# Patient Record
Sex: Female | Born: 1955 | Race: White | Hispanic: No | Smoking: Never smoker
Health system: Southern US, Community
[De-identification: ages and names within clinical notes are randomized; demographics above are authoritative.]

## PROBLEM LIST (undated history)

## (undated) DIAGNOSIS — L509 Urticaria, unspecified: Secondary | ICD-10-CM

## (undated) DIAGNOSIS — T4145XA Adverse effect of unspecified anesthetic, initial encounter: Secondary | ICD-10-CM

## (undated) DIAGNOSIS — T8859XA Other complications of anesthesia, initial encounter: Secondary | ICD-10-CM

## (undated) DIAGNOSIS — F329 Major depressive disorder, single episode, unspecified: Secondary | ICD-10-CM

## (undated) DIAGNOSIS — I10 Essential (primary) hypertension: Secondary | ICD-10-CM

## (undated) DIAGNOSIS — F419 Anxiety disorder, unspecified: Secondary | ICD-10-CM

## (undated) DIAGNOSIS — F32A Depression, unspecified: Secondary | ICD-10-CM

## (undated) DIAGNOSIS — C801 Malignant (primary) neoplasm, unspecified: Secondary | ICD-10-CM

## (undated) DIAGNOSIS — D3000 Benign neoplasm of unspecified kidney: Secondary | ICD-10-CM

## (undated) HISTORY — PX: TUBAL LIGATION: SHX77

## (undated) HISTORY — DX: Urticaria, unspecified: L50.9

## (undated) HISTORY — DX: Essential (primary) hypertension: I10

## (undated) HISTORY — DX: Major depressive disorder, single episode, unspecified: F32.9

## (undated) HISTORY — DX: Anxiety disorder, unspecified: F41.9

## (undated) HISTORY — DX: Depression, unspecified: F32.A

## (undated) HISTORY — PX: OTHER SURGICAL HISTORY: SHX169

## (undated) HISTORY — PX: APPENDECTOMY: SHX54

---

## 1997-08-09 ENCOUNTER — Ambulatory Visit (HOSPITAL_BASED_OUTPATIENT_CLINIC_OR_DEPARTMENT_OTHER): Admission: RE | Admit: 1997-08-09 | Discharge: 1997-08-09 | Payer: Self-pay | Admitting: Otolaryngology

## 1999-12-29 HISTORY — PX: HIATAL HERNIA REPAIR: SHX195

## 2000-01-25 ENCOUNTER — Encounter (INDEPENDENT_AMBULATORY_CARE_PROVIDER_SITE_OTHER): Payer: Self-pay | Admitting: Specialist

## 2000-01-25 ENCOUNTER — Inpatient Hospital Stay (HOSPITAL_COMMUNITY): Admission: RE | Admit: 2000-01-25 | Discharge: 2000-01-28 | Payer: Self-pay | Admitting: *Deleted

## 2000-09-25 ENCOUNTER — Emergency Department (HOSPITAL_COMMUNITY): Admission: EM | Admit: 2000-09-25 | Discharge: 2000-09-25 | Payer: Self-pay

## 2000-09-28 ENCOUNTER — Emergency Department (HOSPITAL_COMMUNITY): Admission: EM | Admit: 2000-09-28 | Discharge: 2000-09-28 | Payer: Self-pay | Admitting: Emergency Medicine

## 2000-12-04 ENCOUNTER — Encounter: Payer: Self-pay | Admitting: Urology

## 2000-12-04 ENCOUNTER — Ambulatory Visit (HOSPITAL_COMMUNITY): Admission: RE | Admit: 2000-12-04 | Discharge: 2000-12-04 | Payer: Self-pay | Admitting: Urology

## 2003-07-26 ENCOUNTER — Encounter: Admission: RE | Admit: 2003-07-26 | Discharge: 2003-07-26 | Payer: Self-pay | Admitting: Internal Medicine

## 2003-08-09 ENCOUNTER — Encounter: Admission: RE | Admit: 2003-08-09 | Discharge: 2003-09-30 | Payer: Self-pay | Admitting: Orthopedic Surgery

## 2004-07-12 ENCOUNTER — Other Ambulatory Visit: Admission: RE | Admit: 2004-07-12 | Discharge: 2004-07-12 | Payer: Self-pay | Admitting: Internal Medicine

## 2005-10-28 ENCOUNTER — Ambulatory Visit: Payer: Self-pay | Admitting: Family Medicine

## 2005-11-29 ENCOUNTER — Ambulatory Visit: Payer: Self-pay | Admitting: Family Medicine

## 2005-11-29 LAB — CONVERTED CEMR LAB
BUN: 11 mg/dL (ref 6–23)
CO2: 29 meq/L (ref 19–32)
Calcium: 9.4 mg/dL (ref 8.4–10.5)
Chloride: 106 meq/L (ref 96–112)
Creatinine, Ser: 0.9 mg/dL (ref 0.4–1.2)
GFR calc non Af Amer: 71 mL/min
Glomerular Filtration Rate, Af Am: 86 mL/min/{1.73_m2}
Glucose, Bld: 103 mg/dL — ABNORMAL HIGH (ref 70–99)
Potassium: 4.8 meq/L (ref 3.5–5.1)
Sodium: 141 meq/L (ref 135–145)

## 2006-04-17 ENCOUNTER — Ambulatory Visit: Payer: Self-pay | Admitting: Family Medicine

## 2006-04-23 ENCOUNTER — Encounter: Payer: Self-pay | Admitting: Cardiology

## 2006-04-23 ENCOUNTER — Observation Stay (HOSPITAL_COMMUNITY): Admission: EM | Admit: 2006-04-23 | Discharge: 2006-04-24 | Payer: Self-pay | Admitting: Emergency Medicine

## 2006-04-23 ENCOUNTER — Ambulatory Visit: Payer: Self-pay | Admitting: *Deleted

## 2006-05-02 ENCOUNTER — Ambulatory Visit: Payer: Self-pay

## 2006-05-05 ENCOUNTER — Ambulatory Visit: Payer: Self-pay | Admitting: Cardiology

## 2006-06-13 ENCOUNTER — Ambulatory Visit: Payer: Self-pay | Admitting: Family Medicine

## 2006-06-16 DIAGNOSIS — Z8679 Personal history of other diseases of the circulatory system: Secondary | ICD-10-CM

## 2006-06-16 DIAGNOSIS — Z9189 Other specified personal risk factors, not elsewhere classified: Secondary | ICD-10-CM | POA: Insufficient documentation

## 2006-06-16 DIAGNOSIS — I10 Essential (primary) hypertension: Secondary | ICD-10-CM

## 2006-06-16 DIAGNOSIS — A6000 Herpesviral infection of urogenital system, unspecified: Secondary | ICD-10-CM

## 2006-06-16 DIAGNOSIS — Z8719 Personal history of other diseases of the digestive system: Secondary | ICD-10-CM | POA: Insufficient documentation

## 2006-07-31 ENCOUNTER — Ambulatory Visit: Payer: Self-pay | Admitting: Family Medicine

## 2006-07-31 DIAGNOSIS — F411 Generalized anxiety disorder: Secondary | ICD-10-CM

## 2009-11-25 ENCOUNTER — Emergency Department (HOSPITAL_COMMUNITY): Admission: EM | Admit: 2009-11-25 | Discharge: 2009-11-26 | Payer: Self-pay | Admitting: Emergency Medicine

## 2010-04-11 LAB — POCT I-STAT, CHEM 8
Glucose, Bld: 149 mg/dL — ABNORMAL HIGH (ref 70–99)
HCT: 44 % (ref 36.0–46.0)
Hemoglobin: 15 g/dL (ref 12.0–15.0)
Potassium: 4 mEq/L (ref 3.5–5.1)
Sodium: 138 mEq/L (ref 135–145)
TCO2: 23 mmol/L (ref 0–100)

## 2010-04-11 LAB — POCT CARDIAC MARKERS
CKMB, poc: <1 ng/mL — ABNORMAL LOW (ref 1.0–8.0)
CKMB, poc: <1 ng/mL — ABNORMAL LOW (ref 1.0–8.0)

## 2010-04-11 LAB — CBC
HCT: 40.4 % (ref 36.0–46.0)
Hemoglobin: 13.7 g/dL (ref 12.0–15.0)
MCH: 28.8 pg (ref 26.0–34.0)
MCHC: 33.9 g/dL (ref 30.0–36.0)
MCV: 85.1 fL (ref 78.0–100.0)
Platelets: 343 10*3/uL (ref 150–400)
RBC: 4.75 MIL/uL (ref 3.87–5.11)
RDW: 13.8 % (ref 11.5–15.5)
WBC: 10 10*3/uL (ref 4.0–10.5)

## 2010-04-11 LAB — DIFFERENTIAL
Basophils Relative: 0 % (ref 0–1)
Eosinophils Absolute: 0.1 10*3/uL (ref 0.0–0.7)
Eosinophils Relative: 1 % (ref 0–5)
Monocytes Relative: 6 % (ref 3–12)
Neutrophils Relative %: 76 % (ref 43–77)

## 2010-06-15 NOTE — H&P (Signed)
[name], [name]                   ACCOUNT NO.:  [id number]   MEDICAL RECORD NO.:  [id number]          PATIENT TYPE:  EMS   LOCATION:  MAJO                         FACILITY:  [facility]   PHYSICIAN:  Thomas C. Wall, MD, FACCDATE OF BIRTH:  1955-06-01   DATE OF ADMISSION:  04/22/2006  DATE OF DISCHARGE:                              HISTORY & PHYSICAL   CHIEF COMPLAINT:  Chest pain.   HISTORY OF PRESENT ILLNESS:  This is a 55 year old woman with a history  of hypertension who comes in with the onset of chest pressure this  evening around 7 p.m. while watching television.  She describes the pain  as a 2/3 pressure since it was not truly a pain.  It was associated with  some palmar sweating but no nausea and vomiting, shortness of breath, or  radiating pain.  She states that she had similar symptoms when her  husband from a massive pulmonary embolism.  She states that she has been  a little bit stressed out today after she scraped two cars in her garage  while trying to park her car.  She has noticed over the course of the  evening that her blood pressure has climbed and the systolic of 180 and  she had an overwhelming, flushing sensation from her head down.  The  patient denies any PND, orthopnea, or lower extremity edema.  She denies  any problems with any recent palpitations or focal weakness.  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   REVIEW OF SYSTEMS:  As above.  The patient also admits to having lost 35  pounds last year.  However, took on 13 pounds over the holidays.  Remaining 18-point review of systems is negative.   PAST MEDICAL HISTORY:  1. Hypertension.  2. History of paraesophageal hernia.  3. Palpitations.  4. Mitral valve prolapse.   ALLERGIES:  SULFA.   MEDICATIONS:  1. Aspirin 81 mg daily.  2. Benicar 40 mg daily.  This was increased to 40 mg approximately 4      days ago.  3. Toprol-XL 100 mg daily.  4. __________ multivitamins.  5. Vitamin C, vitamin E, vitamin D supplements.  6. Xanax p.r.n.   SOCIAL HISTORY:  She lives in [place] with her son who is currently  traveling [place].  She is currently unemployed.  She has never smoked.  She does not use drugs or alcohol.  She is widowed.  Her husband died of  a massive PE.   FAMILY HISTORY:  Mother is alive with hypertension.  Her father is  alive.  Had an MI at age 23 and has already had a quadruple bypass and  has multiple peripheral vascular disease problems.   PHYSICAL EXAMINATION:  VITAL SIGNS:  Blood pressure 179/103, respiratory  rate 18, pulse 96, temperature 98.2, oxygen saturation 96% on room air.  GENERAL:  She is alert and oriented x3.  No acute distress.  HEENT:  Normocephalic, atraumatic.  PERRL.  Oropharynx clear.  NECK:  Supple.  No lymphadenopathy.  2+ carotid impulses, symmetrically  bilaterally.  JVP is flat.  LUNGS:  Clear to auscultation bilaterally without any residual rhonchi  or rales.  CARDIOVASCULAR:  Regular rate and rhythm.  Normal S1 and S2.  No  murmurs, rubs or gallops.  ABDOMEN:  Positive bowel sounds.  Soft, nontender, nondistended without  any palpable masses.  EXTREMITIES:  No rashes or lesions.  Two plus radial and posterior  tibialis pulses bilaterally.  No lower extremity edema, clubbing or  cyanosis.  NEUROLOGIC:  Alert and oriented x3.  Cranial nerves III-XII intact.  She  is moving all extremities spontaneously and shows 5/5 motor strength  symmetrically bilaterally.   LABORATORY DATA:  Chest x-ray unremarkable without any evidence of  effusion or infiltrate.  EKG shows a normal sinus rhythm at a rate of  107 with a normal axis with about 1 mm ST depression  in V5 and V6 as  well as leads I and II.  This is different from an EKG compared to  September 2002.  White count is 10.7, hematocrit of 41, platelets 297.  __________ 3.9.  Creatinine 1.  Glucose 130.  First set of cardiac  markers are negative.  A D-dimer is  less than 0.22.   ASSESSMENT:  1. Atypical chest pain, most likely secondary to poorly controlled      hypertension.  2. Anxiety.   PLAN:  The patient will be admitted to a telemetry bed for observation.  This given her elevated blood pressures and some ECG changes that are  new that can be suggestive of ischemia.  She will be ruled out for an MI  by cardiac enzymes x3.  However, I think there is low probability of  acute coronary syndrome given the other circumstances surrounding her  presentation, most notably her high blood pressure and anxiety.  I have  added HCTZ to her blood pressure regimen as well as Nitrol paste at this  point in time to her outpatient regimen of Benicar and Toprol-XL.  Her  Benicar has been recently increased to 40 mg by her primary care  physician approximately four days ago.     ______________________________  [name]. Nichole Burger, MD      [name]. Nichole Squibb, MD, [facility]  Electronically Signed    BRS/MEDQ  D:  04/23/2006  T:  04/23/2006  Job:  [id number]

## 2010-06-15 NOTE — Discharge Summary (Signed)
[name], [name]                   ACCOUNT NO.:  [id number]   MEDICAL RECORD NO.:  [id number]          PATIENT TYPE:  OBV   LOCATION:  3703                         FACILITY:  [facility]   PHYSICIAN:  Jonelle Sidle, MD DATE OF BIRTH:  [birth year]   DATE OF ADMISSION:  04/22/2006  DATE OF DISCHARGE:  04/24/2006                               DISCHARGE SUMMARY   PRIMARY CARDIOLOGIST:  Dr. Simona Huh.   PRIMARY CARE PHYSICIAN:  [facility].   PROCEDURES PERFORMED DURING HOSPITALIZATION:  None.   PRIMARY DISCHARGE DIAGNOSES:  1. Chest pain.  2. Hypertension.  3. Frequent premature ventricular contractions.  4. Anxiety.   HISTORY OF PRESENT ILLNESS:  This is a 55 year old female who presented  to [facility] Emergency Room with a history of chest discomfort.  The  patient states it lasts a few seconds, is episodic.  The pain is located  in the left chest without radiation.  Characterized as sharp.  The  patient did not state that it caused any shortness of breath, dizziness,  nausea or diaphoresis.  The patient also has been having a lot of stress  in her life over the last few weeks prior to ER admission.  The patient  was seen and examined by cardiology fellow, Dr. Clelia Croft, and admitted to  rule out myocardial infarction.  The patient was placed on telemetry,  nitroglycerin topically and returned to current medications that she was  taking at home.   It was noted that the patient was hypertensive on admission with a blood  pressure of 179/103.  Pulse was 96.  Respirations were 18.  She was  placed back on Norvasc (she had been on this previously) in addition to  her regular regiment and blood pressure normalized to 116/88.  D-dimer  was completed and was found to be negative.  Troponins were cycled and  found to be negative x3.  All other lab work was within normal limits.   The patient was seen and examined the following morning by Dr. Simona Huh with review of  records and labs.  Patient's EKG did show sinus  tachycardia with nonspecific ST-T wave changes.  Heart rate improved  with hypertension control and anxiolytic therapy.  She also had no  further chest pain.   On day of discharge, the patient was seen and examined by Dr. Diona Browner  without any further evidence of chest pain or dyspnea with use of Xanax.  She was found to be stable for discharge and scheduled for outpatient  followup to include an adenosine Myoview stress test.   Echocardiogram was completed during hospitalization, read overall left  ventricular systolic function normal, left ventricular ejection fraction  was estimated to be 60%.  There was no diagnostic evidence of left  ventricular regional wall motion abnormalities.  Normal aortic valve and  normal mitral valve, as read per Dr. Willa Rough.   Labs, on discharge, hemoglobin 14.2, hematocrit 42.3, white blood cell  is 10.4, platelets 301.  Indices were negative.  Pro time 13.3, INR 1.0.  D-dimer negative at 0.22.  Sodium 139, potassium 3.5, chloride 108, CO2  24, glucose 125, BUN 15, creatinine 0.84.  LFTs were within normal  limits.  Troponin 0.04, 0.02 and 0.02 respectively.  Cholesterol 198,  triglycerides 54, HDL 62, LDL 125.  TSH 0.667.   Vitals, on discharge, blood pressure 116/88, heart rate 90, respirations  15, O2 saturation 99% on room air.   EKG:  Normal sinus rhythm with nonspecific ST-T wave abnormalities  anteriorly with T-wave inversion noted.   DISCHARGE APPOINTMENTS AND PLANS:  1. The patient is scheduled for a stress Myoview cardiac stress test      on April 4 at 7:15 a.m.  2. The patient is follow up with Dr. Diona Browner on April 7 at 4 p.m. for      discussion of results and discharge followup.  3. The patient is to make her own appointment with her primary care      physician on discharge.   DISCHARGE MEDICATIONS:  1. Benicar 40 mg once a day (prescription provided).  2. Xanax 0.25 mg twice a  day p.r.n. (prescription provided).  3. Metoprolol 100 mg daily (prescription provided).  4. Amlodipine 10 mg 1 p.o. daily (prescription provided).  5. Aspirin 81 mg a day.  6. Multivitamin daily as taken at home.   ALLERGIES:  SULFA.   Time spent with the patient, to include physician time, 30 minutes.      Bettey Mare. Lyman Bishop, NP      Jonelle Sidle, MD  Electronically Signed    KML/MEDQ  D:  04/24/2006  T:  04/24/2006  Job:  [id number]

## 2010-06-15 NOTE — Op Note (Signed)
[facility]  Patient:    [name], [name]                          MRN: [id number] Proc. Date: 01/25/00 Adm. Date:  [id number] Attending:  Stephenie Acres CC:         Roosvelt Harps, M.D.  Redmond Baseman, M.D.   Operative Report  PREOPERATIVE DIAGNOSIS:  Type 2 paraesophageal hernia.  POSTOPERATIVE DIAGNOSIS:  Type 2 paraesophageal hernia.  PROCEDURE:  Repair of paraesophageal hernia and gastropexy.  SURGEON:  Catalina Lunger, M.D.  ASSISTANT:  Anselm Pancoast. Zachery Dakins, M.D.  ANESTHESIA:  General.  INDICATIONS AND CONSENT:  The patient is a very pleasant 55 year old white female who had been noticing worsening epigastric and left chest pain over the last month.  The patients workup was consistent with a barium swallow which showed a rather large paraesophageal hernia.  Because of the nature and risks associated with paraesophageal hernia, I had a long discussion with the patient and we opted for surgical repair.  Informed consent was granted.  DESCRIPTION OF PROCEDURE:  The patient was taken to the operating room and placed in a supine position.  After adequate anesthesia was induced using endotracheal tube, the abdomen was prepped and draped in a normal sterile fashion.  Using an upper midline incision, I dissected down to the fascia. The fascia was opened vertically.  The peritoneum was entered.  The falciform ligament was divided and ligated with 2-0 silk ligatures.  The triangular ligament of the liver was taken down as was the coronary ligament.  This mobilized the left lateral segment of the liver and the gastrohepatic membrane was incised.  The esophagus was visualized and was found to be totally intra-abdominal.  However, there was a large left anterior paraesophageal hernia.  The stomach was reduced.  The hernia was then grasped with Allis clamps and dissected using Bovie electrocautery off the left crus of the diaphragm,  and off the left wall of the esophagus.  This was sent for pathologic evaluation.  The defect was then closed using interrupted #1 Tycron sutures, approximating the left and right crus.  These were spaced approximately 7-8 mm apart.  After the first stitch, this allowed my middle finger to easily slide next to the esophagus without any constriction.  Satisfied with the repair, I opted to pexed to the anterior abdominal wall.  Therefore, the fundus was pexed to the left anterior abdominal wall using interrupted 2-0 silk ligatures.  Adequate hemostasis was ensured.  The fascia was closed with a running #1 Novofil.  The skin was closed with staples.  The patient tolerated the procedure well and went to PACU in good condition. DD:  01/25/00 TD:  01/26/00 Job: [id number]

## 2010-06-15 NOTE — Assessment & Plan Note (Signed)
[facility]                            CARDIOLOGY OFFICE NOTE   NAME:Peloso, DEKISHA ZEEB                          MRN:          [id number]  DATE:05/05/2006                            DOB:          1955/08/17    PRIMARY CARE PHYSICIAN:  Leanne Chang, M.D.   REASON FOR VISIT:  Followup hospitalization.   HISTORY OF PRESENT ILLNESS:  I saw Ms. Doshier during a recent hospital  stay. She was admitted with chest pain and an abnormal electrocardiogram  also associated with hypertension. She was observed with medication  adjustments and noted ultimately to have normal troponin levels as well  as a normal D-dimer level. She improved after being started on Xanax and  also after being resumed on Norvasc which had been discontinued  previously. She had an echocardiogram obtained during the hospital stay  which demonstrated normal left ventricular systolic function at 60% with  no regional wall motion abnormalities and a normal mitral valve  (previously reported history of mitral valve prolapse). This was all  reviewed with the patient and she was discharged with scheduled followup  to have an outpatient myocardial perfusion study. She underwent an  exercise Myoview on the 4th of this month which demonstrated no sign of  scar or ischemia with an ejection fraction of 71%. Symptomatically, Ms.  Fernandez states that she feels much better. Her medications are outlined  below.   ALLERGIES:  SULFA DRUGS.   CURRENT MEDICATIONS:  1. Benicar 40 mg p.o. daily.  2. Metoprolol 100 mg p.o. daily.  3. Amlodipine 10 mg p.o. daily.  4. Aspirin 81 mg p.o. daily.  5. Multivitamin 1 p.o. daily.  6. Lysine.  7. Vitamin C.  8. Vitamin E.  9. Xanax 0.25 mg p.o. b.i.d. p.r.n.   REVIEW OF SYSTEMS:  As described in the history of present illness.   PHYSICAL EXAMINATION:  VITAL SIGNS:  Blood pressure is 128/85, heart  rate is 100 although decreases to 90 after being seated for several  minutes. Weight is 192 pounds.  GENERAL:  The patient is comfortable and in no acute distress.  NECK:  No elevated jugular venous pressure.  LUNGS:  Clear without labored breathing.  CARDIAC:  Reveals a regular rate and rhythm without murmur or gallop.  EXTREMITIES:  Showed no pitting edema.   IMPRESSION:  1. Recent presentation with chest pain, hypertension and anxiety.      Cardiac markers and D-dimer were normal. Followup Myoview also      shows no sign of scar or ischemia with normal ejection fraction. No      significant mitral valve prolapse identified by hospital      echocardiogram. At this point, we anticipate continued medical      therapy and observation. She will continue to followup with Dr.      Blossom Hoops at this point and we can see her back as needed.  2. Cardiology followup will be p.r.n.     Jonelle Sidle, MD  Electronically Signed    SGM/MedQ  DD: 05/05/2006  DT: 05/06/2006  Job #:  [id number]   cc:   Leanne Chang, M.D.

## 2010-06-15 NOTE — Discharge Summary (Signed)
[facility]  Patient:    [name], [name]                          MRN: [id number] Adm. Date:  [id number] Disc. Date: 01/28/00 Attending:  Stephenie Acres CC:         Roosvelt Harps, M.D.   Discharge Summary  ADMITTING DIAGNOSIS:  Paraesophageal hernia.  DISCHARGE DIAGNOSES:  Paraesophageal hernia.  CONDITION ON DISCHARGE:  Good.  CONSULTING PHYSICIAN:  Roosvelt Harps, M.D.  DISPOSITION:  Discharged to home.  FOLLOW-UP:  With me in seven days for staple removal.  HISTORY OF PRESENT ILLNESS:  The patient is a 55 year old white female with a symptomatic paraesophageal hernia.  The patient has undergone work-up including upper GI and is having increasingly symptomatic chest and abdominal pain and presents now for semi-elective repair.  For the remainder of the H&P, please see the chart.  HOSPITAL COURSE:  The patient was admitted and taken to the operating room where underwent repair of her paraesophageal hernia and gastropexy. Postoperatively her NG tube was removed on postoperative day #1.  She was maintained with epidural anesthesia.  She was ambulating by postoperative day #1.  By postoperative day #2, she was having no nausea and was started on a clear liquid diet.  She tolerated this well and began passing flatus.  By postoperative day #3, she tolerated a regular lunch and was ready for discharge home.  DISCHARGE MEDICATIONS: 1. Vicodin 1-2 p.o. q.4h. p.r.n. pain. 2. Toprol XL 50 mg p.o. q.a.m. DD:  01/28/00 TD:  01/28/00 Job: [id number]

## 2010-06-15 NOTE — Consult Note (Signed)
[facility]  Patient:    [name], [name] Visit Number: [id number] MRN: [id number]          Service Type: EMS Location: [place] Attending Physician:  Cathren Laine Dictated by:   Alvia Grove., M.D. Admit Date:  09/28/2000   CC:         Redmond Baseman, M.D.   Consultation Report  REASON FOR CONSULTATION:  [name] is a 55 year old female who was seen in the emergency room for episode of palpitations and lightheadedness.  HISTORY OF PRESENT ILLNESS:  [name] is a 55 year old female who has had palpitations since the mid-70s.  She had been previously diagnosed as having mitral valve prolapse.  Several years ago, she was diagnosed as having hypertension and was placed on Toprol XL.  She stopped taking the Toprol this past June.  She said she was tired of taking the medication.  She also joined [employer] this past year and has lost 35 pounds.  Starting this past Thursday, she has had lots of palpitations.  These palpitations are sudden, heart beat irregularities which cause her to be lightheaded, and are associated with some left arm tingling.  She developed some sweats and developed a cold sensation.  These palpitations are not associated with eating, drinking, or any sort of activity.  She has had some dizziness with walking and has been to the emergency room on two occasions.  She has not had any episodes of chest pain or shortness of breath.  She does not do any regular exercise.  CURRENT MEDICATIONS:  Toprol XL 50 mg q.d. for the past four days.  PAST MEDICAL HISTORY:  PVCs.  History of mitral valve prolapse diagnosed in the 1970s.  History of hypertension.  SOCIAL HISTORY:  The patient is a nonsmoker and a nondrinker.  FAMILY HISTORY:  Her mother and other female relatives have had arrhythmias. Her father had a myocardial infarction at a relatively young age but he was a smoker.  REVIEW OF SYSTEMS:  She denies any heat  or cold intolerance.  She has had a 35 pound weight loss after she joined [employer].  She denies any heat or cold intolerance.  She denies any problems with her eyes, ears, nose, and throat.  She denies any dizziness or vertigo symptoms.  She denies any shortness of breath.  She denies any chest pain.  She denies any syncopal episodes.  She denies any orthopnea or PND.  She denies any change in her bowel habits, hematuria, or blood in her stool.  She does state that her menstrual periods are coming slightly more frequent and closer together.  The quality of her periods has not changed.  Her review of systems was otherwise reviewed and is negative.  PHYSICAL EXAMINATION:  GENERAL:  She is a young female in no acute distress.  VITAL SIGNS:  Blood pressure is 148/97, heart rate is 88, and she is afebrile.  NECK:  There is 2+ carotids with no bruits.  There is no JVD, no thyromegaly. There is no lymphadenopathy.  LUNGS:  Clear to auscultation.  BACK:  Nontender.  HEART:  Regular rate, S1 and S2.  She has a very soft systolic ejection murmur.  I did not hear a midsystolic click.  Her PMI is nondisplaced.  ABDOMEN:  Mildly obese and is otherwise benign.  She has no hepatosplenomegaly, no masses, and no bruits.  EXTREMITIES:  She has no clubbing, cyanosis, or edema.  PULSES:  Her pulses are 2+ and symmetric bilaterally.  There is no calf tenderness.  NEUROLOGICAL:  Cranial nerves II-XII intact and her motor and sensory function are intact.  Her gait was not assessed but was reportedly fairly normal according to the nurse.  GENITOURINARY:  Deferred.  SKIN:  Warm and dry.  LABORATORY DATA:  Her EKG reveals normal sinus rhythm.  There are no ST or T-wave changes.  Her telemetry strips revealed normal sinus rhythm with several PVCs.  Her laboratory data show her sodium is 142, potassium is 4.1, chloride is 106. Hematocrit is 45%, hemoglobin is 15.  Her creatinine is  0.8.  IMPRESSION:  [name] presents with palpitations that are clearly premature ventricular contractions.  She also has hypertension and some orthostatic hypotension.  At this point, I suspect that her premature ventricular contractions are completely benign.  We will add a TSH to her current laboratory data.  I reassured her that her premature ventricular contractions are not life-threatening and that they should not cause her any particular trouble.  I told her that her weight loss and the increase stress in her life caused by family issues may be exacerbating her problems.  She also may be having some hormonal changes which may be contributing to her premature ventricular contractions.  We will increase her Toprol up to 100 mg a day.  This should help her hypertension as well as PVCs.  I have given her an additional prescription for Propranolol 10 mg tablets which she is to take four times a day as needed.  Because of her irregular periods, I have asked her to see her gynecologic doctor.  I will see her back in the office in a week or so.  We will need to get an echocardiogram for further evaluation. Dictated by:   Alvia Grove., M.D. Attending Physician:  Cathren Laine DD:  09/28/00 TD:  09/29/00 Job: [id number]

## 2011-09-25 DIAGNOSIS — E785 Hyperlipidemia, unspecified: Secondary | ICD-10-CM | POA: Insufficient documentation

## 2011-12-11 ENCOUNTER — Other Ambulatory Visit: Payer: Self-pay | Admitting: Nurse Practitioner

## 2011-12-11 DIAGNOSIS — R2230 Localized swelling, mass and lump, unspecified upper limb: Secondary | ICD-10-CM

## 2011-12-18 ENCOUNTER — Inpatient Hospital Stay: Admission: RE | Admit: 2011-12-18 | Payer: Self-pay | Source: Ambulatory Visit

## 2011-12-18 ENCOUNTER — Other Ambulatory Visit: Payer: Self-pay

## 2011-12-25 ENCOUNTER — Ambulatory Visit
Admission: RE | Admit: 2011-12-25 | Discharge: 2011-12-25 | Disposition: A | Discharge status: Home / Self Care | Payer: BC Managed Care – PPO | Source: Ambulatory Visit | Attending: Nurse Practitioner | Admitting: Nurse Practitioner

## 2011-12-25 DIAGNOSIS — R2230 Localized swelling, mass and lump, unspecified upper limb: Secondary | ICD-10-CM

## 2012-04-02 ENCOUNTER — Encounter: Payer: BC Managed Care – PPO | Admitting: Obstetrics & Gynecology

## 2012-04-13 ENCOUNTER — Ambulatory Visit (INDEPENDENT_AMBULATORY_CARE_PROVIDER_SITE_OTHER): Payer: BC Managed Care – PPO | Admitting: Obstetrics & Gynecology

## 2012-04-13 ENCOUNTER — Encounter: Payer: Self-pay | Admitting: Obstetrics & Gynecology

## 2012-04-13 VITALS — BP 143/90 | HR 77 | Temp 97.1°F | Ht 64.5 in | Wt 216.3 lb

## 2012-04-13 DIAGNOSIS — N898 Other specified noninflammatory disorders of vagina: Secondary | ICD-10-CM

## 2012-04-13 DIAGNOSIS — N950 Postmenopausal bleeding: Secondary | ICD-10-CM | POA: Insufficient documentation

## 2012-04-13 NOTE — Progress Notes (Signed)
Patient ID: [name], female   DOB: 26-Nov-1955, 57 y.o.   MRN: [id number]  Chief Complaint  Patient presents with  . Gynecologic Exam    HPI [name] is a 57 y.o. female.  Referred for evaluation of possible postmenopausal bleeding. She occasionally notes some pink discharge and lower abdominal cramps. No vaginal odor or itch.Patient's last menstrual period was 01/12/2011. [id number]  HPI  Past Medical History  Diagnosis Date  . Hypertension   . Depression   . Anxiety     Past Surgical History  Procedure Laterality Date  . Appendectomy    . Btl      Family History  Problem Relation Age of Onset  . Hypertension Mother   . Diabetes Father   . Cancer Father   . Hypertension Father   . Hypertension Sister   . Cancer Brother   . Hypertension Brother   . Cancer Paternal Aunt   . Cancer Maternal Grandmother   . Cancer Paternal Grandmother     Social History History  Substance Use Topics  . Smoking status: Never Smoker   . Smokeless tobacco: Not on file  . Alcohol Use: 0.6 oz/week    1 Glasses of wine per week    Allergies  Allergen Reactions  . Cola (Syrup) Anaphylaxis  . Sulfa Antibiotics Hives and Rash    Current Outpatient Prescriptions  Medication Sig Dispense Refill  . amLODipine-olmesartan (AZOR) 10-40 MG per tablet Take 1 tablet by mouth daily.      Marland Kitchen Lysine 500 MG CAPS Take by mouth.      . metoprolol succinate (TOPROL-XL) 100 MG 24 hr tablet Take 100 mg by mouth daily. Take with or immediately following a meal.      . Multiple Vitamins-Minerals (MULTIVITAMIN WITH MINERALS) tablet Take 1 tablet by mouth daily.      Marland Kitchen venlafaxine (EFFEXOR) 25 MG tablet Take 25 mg by mouth 2 (two) times daily.      . vitamin E 400 UNIT capsule Take 400 Units by mouth daily.       No current facility-administered medications for this visit.    Review of Systems Review of Systems  Constitutional: Negative.   Genitourinary: Positive for vaginal bleeding. Negative for  dysuria, urgency, vaginal discharge and menstrual problem.    Blood pressure 143/90, pulse 77, temperature 97.1 F (36.2 C), temperature source Oral, height 5' 4.5" (1.638 m), weight 216 lb 4.8 oz (98.113 kg), last menstrual period 01/12/2011.  Physical Exam Physical Exam  Nursing note and vitals reviewed. Constitutional: She appears well-developed and well-nourished. No distress.  Abdominal: Soft. She exhibits no distension and no mass.  Genitourinary: Vagina normal and uterus normal.  Scant mucus d/c with minimal pink staining, no cervical lesion, no masses or tenderness  Musculoskeletal: Normal range of motion. She exhibits no edema.  Skin: Skin is warm.  Psychiatric: She has a normal mood and affect.    Data Reviewed Normal pap Office notes  Assessment    Possible postmenopausal bleeding     Plan    Pelvic US Wet prep  RTC for result        [name] 04/13/2012, 2:04 PM

## 2012-04-13 NOTE — Patient Instructions (Signed)
Postmenopausal Bleeding Menopause is commonly referred to as the "change in life." It is a time when the fertile years, the time of ovulating and having menstrual periods, has come to an end. It is also determined by not having menstrual periods for 12 months.  Postmenopausal bleeding is any bleeding a woman has after she has entered into menopause. Any type of postmenopausal bleeding, even if it appears to be a typical menstrual period, is concerning. This should be evaluated by your caregiver.  CAUSES   Hormone therapy.  Cancer of the cervix or cancer of the lining of the uterus (endometrial cancer).  Thinning of the uterine lining (uterine atrophy).  Thyroid diseases.  Certain medicines.  Infection of the uterus or cervix.  Inflammation or irritation of the uterine lining (endometritis).  Estrogen-secreting tumors.  Growths (polyps) on the cervix, uterine lining, or uterus.  Uterine tumors (fibroids).  Being very overweight (obese). DIAGNOSIS  Your caregiver will take a medical history and ask questions. A physical exam will also be performed. Further tests may include:   A transvaginal ultrasound. An ultrasound wand or probe is inserted into your vagina to view the pelvic organs.  A biopsy of the lining of the uterus (endometrium). A sample of the endometrium is removed and examined.  A hysteroscopy. Your caregiver may use an instrument with a light and a camera attached to it (hysteroscope). The hysteroscope is used to look inside the uterus for problems.  A dilation and curettage (D&C). Tissue is removed from the uterine lining to be examined for problems. TREATMENT  Treatment depends on the cause of the bleeding. Some treatments include:   Surgery.  Medicines.  Hormones.  A hysteroscopy or D&C to remove polyps or fibroids.  Changing or stopping a current medicine you are taking. Talk to your caregiver about your specific treatment. HOME CARE INSTRUCTIONS    Maintain a healthy weight.  Keep regular pelvic exams and Pap tests. SEEK MEDICAL CARE IF:   You have bleeding, even if it is light in comparison to your previous periods.  Your bleeding lasts more than 1 week.  You have abdominal pain.  You develop bleeding with sexual intercourse. SEEK IMMEDIATE MEDICAL CARE IF:   You have a fever, chills, headache, dizziness, muscle aches, and bleeding.  You have severe pain with bleeding.  You are passing blood clots.  You have bleeding and need more than 1 pad an hour.  You feel faint. MAKE SURE YOU:  Understand these instructions.  Will watch your condition.  Will get help right away if you are not doing well or get worse. Document Released: 04/24/2005 Document Revised: 04/08/2011 Document Reviewed: 09/20/2010 Exit[employer] Patient Information 2013 [employer].  

## 2012-04-17 ENCOUNTER — Ambulatory Visit (HOSPITAL_COMMUNITY)
Admission: RE | Admit: 2012-04-17 | Discharge: 2012-04-17 | Disposition: A | Discharge status: Home / Self Care | Payer: BC Managed Care – PPO | Source: Ambulatory Visit | Attending: Obstetrics & Gynecology | Admitting: Obstetrics & Gynecology

## 2012-04-17 DIAGNOSIS — N950 Postmenopausal bleeding: Secondary | ICD-10-CM

## 2012-04-17 DIAGNOSIS — R109 Unspecified abdominal pain: Secondary | ICD-10-CM | POA: Insufficient documentation

## 2012-04-18 LAB — WET PREP, GENITAL
Trich, Wet Prep: NONE SEEN
Yeast Wet Prep HPF POC: NONE SEEN

## 2012-05-01 ENCOUNTER — Encounter: Payer: Self-pay | Admitting: Obstetrics & Gynecology

## 2012-05-01 ENCOUNTER — Other Ambulatory Visit (HOSPITAL_COMMUNITY)
Admission: RE | Admit: 2012-05-01 | Discharge: 2012-05-01 | Disposition: A | Discharge status: Home / Self Care | Payer: BC Managed Care – PPO | Source: Ambulatory Visit | Attending: Obstetrics & Gynecology | Admitting: Obstetrics & Gynecology

## 2012-05-01 ENCOUNTER — Ambulatory Visit (INDEPENDENT_AMBULATORY_CARE_PROVIDER_SITE_OTHER): Payer: BC Managed Care – PPO | Admitting: Obstetrics & Gynecology

## 2012-05-01 VITALS — BP 137/81 | HR 73 | Temp 97.5°F | Ht 65.0 in | Wt 217.5 lb

## 2012-05-01 DIAGNOSIS — N950 Postmenopausal bleeding: Secondary | ICD-10-CM

## 2012-05-01 DIAGNOSIS — C549 Malignant neoplasm of corpus uteri, unspecified: Secondary | ICD-10-CM | POA: Insufficient documentation

## 2012-05-01 NOTE — Progress Notes (Signed)
Patient ID: [name], female   DOB: 1955/06/27, 57 y.o.   MRN: [id number] Patient's last menstrual period was 01/12/2011. Some slight spotting. [place] suspicious for carcinoma, will do Bx today. Procedure and risks explained.   *RADIOLOGY REPORT*  Clinical Data: Right-sided cramping with possible postmenopausal  bleeding. LMP 01/12/2011  TRANSABDOMINAL AND TRANSVAGINAL ULTRASOUND OF PELVIS  Technique: Both transabdominal and transvaginal ultrasound  examinations of the pelvis were performed. Transabdominal technique  was performed for global imaging of the pelvis including uterus,  ovaries, adnexal regions, and pelvic cul-de-sac.  It was necessary to proceed with endovaginal exam following the  transabdominal exam to visualize the myometrium, endometrium and  adnexa.  Comparison: None  Findings:  Uterus: Is anteverted and anteflexed and has a sagittal length of  8.3 cm, depth of 4.2 cm and width of 4.7 cm.  Endometrium: Has a width of 16 mm and this is abnormally thickened  in a postmenopausal patient. There is loss of the endometrial  myometrial interface in the right anterolateral portion of the  uterus with a highly vascular echogenic area appearing to extend  into the myometrium from the endometrium. This appearance is  worrisome for endometrial carcinoma with myometrial extension. The  remainder of the myometrium appears homogeneous. A residual rim of  normal appearing myometrium is identified.  Right ovary: Measures 2.8 x 0.82 x 1.7 cm and has a normal  appearance  Left ovary: Measures 1.9 x 2.1 x 1.1 cm and has a normal appearance  Other findings: No pelvic fluid or separate adnexal masses are  identified.  IMPRESSION:  Thickened endometrial lining in a postmenopausal patient  experiencing vaginal bleeding. 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 Ina Homes. [name].  Original Report Authenticated By: Rhodia Albright, M.D.    Patient given informed consent, signed copy in the chart, time out was performed. Appropriate time out taken. . The patient was placed in the lithotomy position and the cervix brought into view with sterile speculum.  Portio of cervix cleansed x 2 with betadine swabs. The uterus was sounded for depth of 9cm. A pipelle was introduced to into the uterus, suction created,  and an endometrial sample was obtained. All equipment was removed and accounted for.  The patient tolerated the procedure well.    Patient given post procedure instructions. The patient will return in 2 weeks for results.   Adam Phenix, MD limited

## 2012-05-05 ENCOUNTER — Encounter: Payer: Self-pay | Admitting: *Deleted

## 2012-05-18 ENCOUNTER — Ambulatory Visit (INDEPENDENT_AMBULATORY_CARE_PROVIDER_SITE_OTHER): Payer: BC Managed Care – PPO | Admitting: Obstetrics & Gynecology

## 2012-05-18 ENCOUNTER — Encounter: Payer: Self-pay | Admitting: Obstetrics and Gynecology

## 2012-05-18 VITALS — BP 135/90 | HR 92 | Temp 97.3°F | Ht 65.5 in | Wt 215.0 lb

## 2012-05-18 DIAGNOSIS — Z8542 Personal history of malignant neoplasm of other parts of uterus: Secondary | ICD-10-CM | POA: Insufficient documentation

## 2012-05-18 DIAGNOSIS — C541 Malignant neoplasm of endometrium: Secondary | ICD-10-CM | POA: Insufficient documentation

## 2012-05-18 DIAGNOSIS — C549 Malignant neoplasm of corpus uteri, unspecified: Secondary | ICD-10-CM

## 2012-05-18 NOTE — Patient Instructions (Signed)
Cancer of the Uterus The uterus is part of a woman's reproductive system. It is the hollow, pear-shaped organ where a baby grows. The uterus is in the pelvis between the bladder and the rectum. The narrow, lower portion of the uterus is the cervix. The fallopian tubes extend from either side of the top of the uterus to the ovaries. The wall of the uterus has two layers of tissue. The inner layer, or lining, is the endometrium. The outer layer is muscle tissue called the myometrium. In women of childbearing age, the lining of the uterus grows and thickens each month to prepare for pregnancy. If a woman does not become pregnant, the thick, bloody lining flows out of the body through the vagina. This flow is called menstruation. TYPES OF UTERINE CANCER  The most common type of cancer of the uterus begins in the lining (endometrium). It is called endometrial cancer, uterine cancer, or cancer of the uterus. It is seen in 2% to 3% of women.  A different type of cancer, uterine sarcoma, develops in the muscle (myometrium). Cancer that begins in the cervix is also a different type of cancer.  Rarely, a noncancerous fibroid tumor of the uterus develops into a sarcoma. CAUSES  No one knows the exact causes of uterine cancer. But it is clear that this disease is not contagious. No one can "catch" cancer from another person. Women who get this disease are more likely than other women to have certain risk factors. A risk factor is something that increases a person's chance of developing the disease.  Most women who have known risk factors do not get uterine cancer. On the other hand, many who do get this disease have none of these factors. Doctors can seldom explain why one woman gets uterine cancer and another does not.  Studies have found the following risk factors:  Age. Cancer of the uterus occurs mostly in women over age 50.  Endometrial hyperplasia (enlarged endometrium). The risk of uterine cancer is  higher if a woman has endometrial hyperplasia.  Hormone replacement therapy (HRT). HRT is used to control the symptoms of menopause, to prevent osteoporosis (thinning of the bones), and to reduce the risk of heart disease or stroke. Women who still have their uterus, and use estrogen without progesterone, have an increased risk of uterine cancer. Long-term use and large doses of estrogen seem to increase this risk. Women who use a combination of estrogen and progesterone have a lower risk of uterine cancer than women who use estrogen alone. The progesterone protects the uterus from developing cancer.  Obesity and related conditions. The body stores and releases some of its estrogen in fatty tissue. That is why obese women are more likely than thin women to have higher levels of estrogen in their bodies. High levels of estrogen may be the reason that obese women have an increased risk of developing uterine cancer. The risk of this disease is also higher in women with diabetes or high blood pressure. These conditions occur in many obese women.  Tamoxifen. Women taking the drug tamoxifen to prevent or treat breast cancer have an increased risk of uterine cancer. This risk appears to be related to the estrogen-like effect of this drug on the uterus.  Race. White women are more likely than African-American women to get uterine cancer.  Colorectal cancer. Women who have had an inherited form of colorectal cancer have a higher risk of developing uterine cancer than other women.  Infertility.  Beginning menstrual   periods before age 12.  Having menstrual periods after age 52.  History of cancer of the ovary or intestine.  Family history of uterine cancer.  Having diabetes, high blood pressure, thyroid or gallbladder disease.  Long-term use of high does of birth control pills. Birth control pills today are low in hormone doses.  Radiation to the abdomen or pelvis.  Smoking. SYMPTOMS  Uterine  cancer usually occurs after menopause. But it may also occur around the time that menopause begins. Abnormal vaginal bleeding is the most common symptom of uterine cancer. Bleeding may start as a watery, blood-streaked flow that gradually contains more blood. Women should not assume that abnormal vaginal bleeding is part of menopause. A woman should see her caregiver if she has any of the following symptoms:  Unusual vaginal bleeding or discharge.  Difficult or painful urination.  Pain during intercourse.  Pain in the pelvic area.  Increased girth (growth) of the stomach.  Any vaginal bleeding after menopause.  Unexplained weight loss. These symptoms can be caused by cancer or other less serious conditions. Most often they are not cancer. But a thorough evaluation is needed to be certain. DIAGNOSIS  If a woman has symptoms that suggest uterine cancer, her caregiver may check her general health and may order blood and urine tests. The caregiver also may perform one or more of these exams or tests.  Blood and urine tests and chest x-rays. The woman also may have:  Other X-rays.  CT scans.  Ultrasound test.  Magnetic resonance imaging (MRI).  Sigmoidoscopy.  Colonoscopy.  Pelvic exam. A woman will have a pelvic exam to check the vagina, uterus, bladder, and rectum. The caregiver feels these organs for any lumps or changes in their shape or size. To see the upper part of the vagina and the cervix, the caregiver inserts an instrument called a speculum into the vagina.  Pap test. The caregiver collects cells from the cervix and upper vagina. A medical laboratory checks for abnormal cells. The Pap test is better for detecting cancer of the cervix. But cells from inside the uterus usually do not show up on a Pap test. It is not a reliable test for uterine cancer.  Transvaginal ultrasound. The medical caregiver inserts an instrument into the vagina. The instrument aims high-frequency  sound waves at the uterus. The pattern of the echoes they produce creates a picture. If the endometrium looks too thick, the caregiver can do a biopsy.  Biopsy. The medical caregiver removes a sample of tissue from the uterine lining. This usually can be done in the caregiver's office.  Dilatation and Curettage (D&C). In some cases, a woman may need to have a D&C. D&C is usually done as same-day surgery with anesthesia in a hospital. A pathologist examines the tissue (lining of the uterus) to check for cancer cells and other conditions. STAGING   If uterine cancer is diagnosed, the caregiver needs to know the stage, or extent, of the disease to plan the best treatment. Staging is a careful attempt to find out whether the cancer has spread, and if so, to what parts of the body.  When uterine cancer spreads (metastasizes) outside the uterus, cancer cells are often found in nearby lymph nodes, nerves, or blood vessels. If the cancer has reached the lymph nodes, cancer cells may have spread to other lymph nodes and other organs of the body.  Staging is done at the time of surgery. In most cases, the most reliable way to stage   this disease is to remove the uterus, cervix, tubes, ovaries, and lymph nodes. A pathologist uses a microscope to examine the uterus and other tissues removed by the surgeon, to determine the extent of the cancer in the pelvis.  If lymph nodes have cancer cells, other parts of the body are examined, to see if it has spread to other organs. MAIN FEATURES OF EACH STAGE OF THE DISEASE: Stage I. The cancer is only in the body of the uterus. It is not in the cervix. Stage II. The cancer has spread from the body of the uterus to the cervix. Stage III. The cancer has spread outside the uterus, but not outside the pelvis (and not to the bladder or rectum). Lymph nodes in the pelvis may contain cancer cells. Stage IV. The cancer has spread into the bladder or rectum. It may have spread  beyond the pelvis to other body parts. TREATMENT  Women with uterine cancer have many treatment options. Most women with uterine cancer are treated with surgery. Some have radiation or chemotherapy. A smaller number of women may be treated with hormonal therapy. Some patients receive a combination of therapies. You may want to consult with another cancer doctor for a second opinion. The caregiver (usually a cancer doctor) is the best person to describe your treatment choices and to discuss the expected results of treatment. SURGERY  Most women with uterine cancer have surgery to remove the uterus, cervix, tubes, and ovaries (total hysterectomy). This is usually done through an incision in the abdomen.  The doctor may also remove the lymph nodes near the tumor, to see if they contain cancer. If cancer cells have reached the lymph nodes, it may mean that the disease has spread to other parts of the body. If cancer cells have not spread beyond the endometrium, the woman may not need to have any other treatment. The length of the hospital stay may vary from several days to a week. RADIATION THERAPY  In radiation therapy, high-energy rays are used to kill cancer cells. Like surgery, radiation therapy is a local therapy. It affects cancer cells only in the treated area.  Some women with Stage I, II, or III uterine cancer need both radiation therapy and surgery. They may have radiation before surgery to shrink the tumor, or after surgery to destroy any cancer cells that remain in the area. The doctor may suggest radiation treatments for the small number of women who cannot have surgery.  Doctors use two types of radiation therapy to treat uterine cancer:  External radiation. In external radiation therapy, a large machine outside the body is used to aim radiation at the tumor area. The woman usually does not stay overnight (outpatient) at the hospital or clinic, and receives external radiation 5 days a week  for several weeks. This schedule helps protect healthy cells and tissue by spreading out the total dose of radiation. No radioactive materials are put into the body for external radiation therapy.  Internal radiation. In internal radiation therapy, tiny tubes containing a radioactive substance are inserted through the vagina and cervix, into the uterus, and left in place for a few days. The woman stays in the hospital during this treatment. To protect others from radiation exposure, the patient may not be able to have visitors or may have visitors only for a short period of time while the implant is in place. Once the implant is removed, the woman has no radioactivity in her body.  Some patients need both   external and internal radiation therapies. CHEMOTHERAPY Chemotherapy is not usually used for endometrial cancer of the uterus. However, with sarcoma of the uterus or of the fibroid, it may be used in combination with surgery. Chemotherapy may also be used with recurring sarcoma, and in patients who cannot have surgery. HORMONE THERAPY Hormonal therapy involves substances that prevent cancer cells from multiplying or growing by attaching to hormone receptors. This causes changes in cancer cells. Before therapy begins, the caregiver may request a hormone receptor test. This special lab test of uterine tissue helps the caregiver learn if estrogen and progesterone receptors are present. If the tissue has receptors, the woman is more likely to respond to hormonal therapy.  Hormonal therapy is called a systemic therapy, because it can affect cancer cells throughout the body. Usually, hormonal therapy is a type of progesterone, taken as a pill or injection.  The doctor may use hormonal therapy for women with uterine cancer who are unable to have surgery or radiation therapy. Also, the doctor may give hormonal therapy to women with uterine cancer that has spread to the lungs or other distant sites. It is also  given to women with uterine cancer that has come back.  Hormonal therapy can cause a number of side effects. Women taking progesterone may retain fluid, have an increased appetite, and gain weight. Women who are still menstruating may have changes in their periods.  Hormone therapy can be used in combination with surgery or radiation. HOME CARE INSTRUCTIONS   Maintain a normal weight with a healthy balanced diet and exercise.  If you have diabetes, high blood pressure, thyroid or gallbladder disease, keep them in control with your caregiver's treatment and recommendations.  Do not smoke.  Do not take estrogen without taking progesterone with it, for menopausal symptoms.  Join a support group or get counseling, if you would like help dealing with your cancer.  If you are on hormone replacement therapy, see your caregiver as recommended, and be informed about the side effects of HRT.  Women with known risk factors should ask their caregiver what symptoms to look for and how often they should have an examination.  Keep your follow-up appointments and take your medicines as advised.  Write your questions down, and take them with you to your caregiver's appointments.  You may want another person to be with you for your appointments, so you do not miss any instructions. SEEK MEDICAL CARE IF:   You have any abnormal vaginal bleeding.  You are having menstrual periods at the age of 52 or older.  You have bleeding after sexual intercourse.  You are taking tomoxifen and develop vaginal bleeding.  Your stomach is growing, and you are not pregnant.  You have pain with sexual intercourse.  You have stomach or pelvis pain.  You have weight loss for no known reason.  You have pain or difficulty with urination. [facility]  Cancer Information Service (CIS) provides accurate, up-to-date information on cancer to patients and their families, health professionals, and  the general public:  Phone: 1-800-4-CANCER (1-800-422-6237).  Internet: http://www.cancer.gov NCI's website contains complete information about cancer causes and prevention, screening and diagnosis, treatment and survivorship, clinical trials, statistics, funding, training, and employment opportunities, and the Institute and its programs. CLINICAL TRIALS A woman who is interested in being part of a clinical trial should talk with her caregiver. NCI's website (http://www.cancer.gov) provides general information about clinical trials. It also offers detailed information about specific ongoing studies of uterine   cancer by linking to PDQ, a cancer information database developed by the NCI. The Cancer Information Service at 1-800-4-CANCER can answer questions about cancer and provide information from the PDQ database. Document Released: 01/14/2005 Document Revised: 04/08/2011 Document Reviewed: 11/17/2008 ExitCare Patient Information 2013 [employer].  

## 2012-05-18 NOTE — Progress Notes (Signed)
Patient ID: [name], female   DOB: 12-Nov-1955, 57 y.o.   MRN: [id number] Patient's last menstrual period was 05/08/2012. Endometrial biopsy showed grade one carcinoma. No bleeding now, no pain. We discussed the prognosis and management. She accepts referral to [facility] gyn oncology, message sent to Telford Nab.   Adam Phenix, MD 05/18/2012

## 2012-05-25 ENCOUNTER — Telehealth: Payer: Self-pay | Admitting: *Deleted

## 2012-05-25 NOTE — Telephone Encounter (Signed)
Called pt and left message on her personal voice mail regarding her appt details and instructions to arrive @ 1000 on 06/04/12 to [facility].

## 2012-05-25 NOTE — Telephone Encounter (Signed)
Patient will be seeing Dr. Cleda Mccreedy.

## 2012-05-25 NOTE — Telephone Encounter (Signed)
Received call from [place] in GYN ONC.  Patient has been scheduled for appointment on 06/04/12 at 10:30am.  She needs to arrive at 10:00 am for registration at the [facility].

## 2012-05-25 NOTE — Telephone Encounter (Signed)
Joelle called and left a message she saw Dr. Debroah Loop last Monday and was referred to [facility] oncology but hasn't heard anything yet and states is obviously anxious and would like call re: status of referral. Per chart review no appointment listed. Left message for Telford Nab to call re: need for appointment.  Also called Cyrilla and let her know we are working on getting her the appointment as soon as possible.

## 2012-06-04 ENCOUNTER — Encounter: Payer: Self-pay | Admitting: Gynecologic Oncology

## 2012-06-04 ENCOUNTER — Ambulatory Visit: Payer: BC Managed Care – PPO | Attending: Gynecologic Oncology | Admitting: Gynecologic Oncology

## 2012-06-04 VITALS — BP 150/90 | HR 120 | Temp 98.4°F | Resp 22 | Ht 65.0 in | Wt 217.1 lb

## 2012-06-04 DIAGNOSIS — C549 Malignant neoplasm of corpus uteri, unspecified: Secondary | ICD-10-CM | POA: Insufficient documentation

## 2012-06-04 DIAGNOSIS — C541 Malignant neoplasm of endometrium: Secondary | ICD-10-CM

## 2012-06-04 NOTE — Patient Instructions (Signed)
Hysterectomy Information  A hysterectomy is a procedure where your uterus is surgically removed. It will no longer be possible to have menstrual periods or to become pregnant. The tubes and ovaries can be removed (bilateral salpingo-oopherectomy) during this surgery as well.  REASONS FOR A HYSTERECTOMY  Persistent, abnormal bleeding.  Lasting (chronic) pelvic pain or infection.  The lining of the uterus (endometrium) starts growing outside the uterus (endometriosis).  The endometrium starts growing in the muscle of the uterus (adenomyosis).  The uterus falls down into the vagina (pelvic organ prolapse).  Symptomatic uterine fibroids.  Precancerous cells.  Cervical cancer or uterine cancer. TYPES OF HYSTERECTOMIES  Supracervical hysterectomy. This type removes the top part of the uterus, but not the cervix.  Total hysterectomy. This type removes the uterus and cervix.  Radical hysterectomy. This type removes the uterus, cervix, and the fibrous tissue that holds the uterus in place in the pelvis (parametrium). WAYS A HYSTERECTOMY CAN BE PERFORMED  Abdominal hysterectomy. A large surgical cut (incision) is made in the abdomen. The uterus is removed through this incision.  Vaginal hysterectomy. An incision is made in the vagina. The uterus is removed through this incision. There are no abdominal incisions.  Conventional laparoscopic hysterectomy. A thin, lighted tube with a camera (laparoscope) is inserted into 3 or 4 small incisions in the abdomen. The uterus is cut into small pieces. The small pieces are removed through the incisions, or they are removed through the vagina.  Laparoscopic assisted vaginal hysterectomy (LAVH). Three or four small incisions are made in the abdomen. Part of the surgery is performed laparoscopically and part vaginally. The uterus is removed through the vagina.  Robot-assisted laparoscopic hysterectomy. A laparoscope is inserted into 3 or 4 small  incisions in the abdomen. A computer-controlled device is used to give the surgeon a 3D image. This allows for more precise movements of surgical instruments. The uterus is cut into small pieces and removed through the incisions or removed through the vagina. RISKS OF HYSTERECTOMY   Bleeding and risk of blood transfusion. Tell your caregiver if you do not want to receive any blood products.  Blood clots in the legs or lung.  Infection.  Injury to surrounding organs.  Anesthesia problems or side effects.  Conversion to an abdominal hysterectomy. WHAT TO EXPECT AFTER A HYSTERECTOMY  You will be given pain medicine.  You will need to have someone with you for the first 3 to 5 days after you go home.  You will need to follow up with your surgeon in 2 to 4 weeks after surgery to evaluate your progress.  You may have early menopause symptoms like hot flashes, night sweats, and insomnia.  If you had a hysterectomy for a problem that was not a cancer or a condition that could lead to cancer, then you no longer need Pap tests. However, even if you no longer need a Pap test, a regular exam is a good idea to make sure no other problems are starting. Document Released: 07/10/2000 Document Revised: 04/08/2011 Document Reviewed: 08/25/2010 Exit[employer] Patient Information 2013 [employer]. Cancer of the Uterus The uterus is part of a woman's reproductive system. It is the hollow, pear-shaped organ where a baby grows. The uterus is in the pelvis between the bladder and the rectum. The narrow, lower portion of the uterus is the cervix. The fallopian tubes extend from either side of the top of the uterus to the ovaries. The wall of the uterus has two layers of tissue.   The inner layer, or lining, is the endometrium. The outer layer is muscle tissue called the myometrium. In women of childbearing age, the lining of the uterus grows and thickens each month to prepare for pregnancy. If a woman does not  become pregnant, the thick, bloody lining flows out of the body through the vagina. This flow is called menstruation. TYPES OF UTERINE CANCER  The most common type of cancer of the uterus begins in the lining (endometrium). It is called endometrial cancer, uterine cancer, or cancer of the uterus. It is seen in 2% to 3% of women.  A different type of cancer, uterine sarcoma, develops in the muscle (myometrium). Cancer that begins in the cervix is also a different type of cancer.  Rarely, a noncancerous fibroid tumor of the uterus develops into a sarcoma. CAUSES  No one knows the exact causes of uterine cancer. But it is clear that this disease is not contagious. No one can "catch" cancer from another person. Women who get this disease are more likely than other women to have certain risk factors. A risk factor is something that increases a person's chance of developing the disease.  Most women who have known risk factors do not get uterine cancer. On the other hand, many who do get this disease have none of these factors. Doctors can seldom explain why one woman gets uterine cancer and another does not.  Studies have found the following risk factors:  Age. Cancer of the uterus occurs mostly in women over age 50.  Endometrial hyperplasia (enlarged endometrium). The risk of uterine cancer is higher if a woman has endometrial hyperplasia.  Hormone replacement therapy (HRT). HRT is used to control the symptoms of menopause, to prevent osteoporosis (thinning of the bones), and to reduce the risk of heart disease or stroke. Women who still have their uterus, and use estrogen without progesterone, have an increased risk of uterine cancer. Long-term use and large doses of estrogen seem to increase this risk. Women who use a combination of estrogen and progesterone have a lower risk of uterine cancer than women who use estrogen alone. The progesterone protects the uterus from developing cancer.  Obesity  and related conditions. The body stores and releases some of its estrogen in fatty tissue. That is why obese women are more likely than thin women to have higher levels of estrogen in their bodies. High levels of estrogen may be the reason that obese women have an increased risk of developing uterine cancer. The risk of this disease is also higher in women with diabetes or high blood pressure. These conditions occur in many obese women.  Tamoxifen. Women taking the drug tamoxifen to prevent or treat breast cancer have an increased risk of uterine cancer. This risk appears to be related to the estrogen-like effect of this drug on the uterus.  Race. White women are more likely than African-American women to get uterine cancer.  Colorectal cancer. Women who have had an inherited form of colorectal cancer have a higher risk of developing uterine cancer than other women.  Infertility.  Beginning menstrual periods before age 12.  Having menstrual periods after age 52.  History of cancer of the ovary or intestine.  Family history of uterine cancer.  Having diabetes, high blood pressure, thyroid or gallbladder disease.  Long-term use of high does of birth control pills. Birth control pills today are low in hormone doses.  Radiation to the abdomen or pelvis.  Smoking. SYMPTOMS  Uterine cancer usually occurs   after menopause. But it may also occur around the time that menopause begins. Abnormal vaginal bleeding is the most common symptom of uterine cancer. Bleeding may start as a watery, blood-streaked flow that gradually contains more blood. Women should not assume that abnormal vaginal bleeding is part of menopause. A woman should see her caregiver if she has any of the following symptoms:  Unusual vaginal bleeding or discharge.  Difficult or painful urination.  Pain during intercourse.  Pain in the pelvic area.  Increased girth (growth) of the stomach.  Any vaginal bleeding after  menopause.  Unexplained weight loss. These symptoms can be caused by cancer or other less serious conditions. Most often they are not cancer. But a thorough evaluation is needed to be certain. DIAGNOSIS  If a woman has symptoms that suggest uterine cancer, her caregiver may check her general health and may order blood and urine tests. The caregiver also may perform one or more of these exams or tests.  Blood and urine tests and chest x-rays. The woman also may have:  Other X-rays.  CT scans.  Ultrasound test.  Magnetic resonance imaging (MRI).  Sigmoidoscopy.  Colonoscopy.  Pelvic exam. A woman will have a pelvic exam to check the vagina, uterus, bladder, and rectum. The caregiver feels these organs for any lumps or changes in their shape or size. To see the upper part of the vagina and the cervix, the caregiver inserts an instrument called a speculum into the vagina.  Pap test. The caregiver collects cells from the cervix and upper vagina. A medical laboratory checks for abnormal cells. The Pap test is better for detecting cancer of the cervix. But cells from inside the uterus usually do not show up on a Pap test. It is not a reliable test for uterine cancer.  Transvaginal ultrasound. The medical caregiver inserts an instrument into the vagina. The instrument aims high-frequency sound waves at the uterus. The pattern of the echoes they produce creates a picture. If the endometrium looks too thick, the caregiver can do a biopsy.  Biopsy. The medical caregiver removes a sample of tissue from the uterine lining. This usually can be done in the caregiver's office.  Dilatation and Curettage (D&C). In some cases, a woman may need to have a D&C. D&C is usually done as same-day surgery with anesthesia in a hospital. A pathologist examines the tissue (lining of the uterus) to check for cancer cells and other conditions. STAGING   If uterine cancer is diagnosed, the caregiver needs to know the  stage, or extent, of the disease to plan the best treatment. Staging is a careful attempt to find out whether the cancer has spread, and if so, to what parts of the body.  When uterine cancer spreads (metastasizes) outside the uterus, cancer cells are often found in nearby lymph nodes, nerves, or blood vessels. If the cancer has reached the lymph nodes, cancer cells may have spread to other lymph nodes and other organs of the body.  Staging is done at the time of surgery. In most cases, the most reliable way to stage this disease is to remove the uterus, cervix, tubes, ovaries, and lymph nodes. A pathologist uses a microscope to examine the uterus and other tissues removed by the surgeon, to determine the extent of the cancer in the pelvis.  If lymph nodes have cancer cells, other parts of the body are examined, to see if it has spread to other organs. MAIN FEATURES OF EACH STAGE OF THE DISEASE:   Stage I. The cancer is only in the body of the uterus. It is not in the cervix. Stage II. The cancer has spread from the body of the uterus to the cervix. Stage III. The cancer has spread outside the uterus, but not outside the pelvis (and not to the bladder or rectum). Lymph nodes in the pelvis may contain cancer cells. Stage IV. The cancer has spread into the bladder or rectum. It may have spread beyond the pelvis to other body parts. TREATMENT  Women with uterine cancer have many treatment options. Most women with uterine cancer are treated with surgery. Some have radiation or chemotherapy. A smaller number of women may be treated with hormonal therapy. Some patients receive a combination of therapies. You may want to consult with another cancer doctor for a second opinion. The caregiver (usually a cancer doctor) is the best person to describe your treatment choices and to discuss the expected results of treatment. SURGERY  Most women with uterine cancer have surgery to remove the uterus, cervix, tubes,  and ovaries (total hysterectomy). This is usually done through an incision in the abdomen.  The doctor may also remove the lymph nodes near the tumor, to see if they contain cancer. If cancer cells have reached the lymph nodes, it may mean that the disease has spread to other parts of the body. If cancer cells have not spread beyond the endometrium, the woman may not need to have any other treatment. The length of the hospital stay may vary from several days to a week. RADIATION THERAPY  In radiation therapy, high-energy rays are used to kill cancer cells. Like surgery, radiation therapy is a local therapy. It affects cancer cells only in the treated area.  Some women with Stage I, II, or III uterine cancer need both radiation therapy and surgery. They may have radiation before surgery to shrink the tumor, or after surgery to destroy any cancer cells that remain in the area. The doctor may suggest radiation treatments for the small number of women who cannot have surgery.  Doctors use two types of radiation therapy to treat uterine cancer:  External radiation. In external radiation therapy, a large machine outside the body is used to aim radiation at the tumor area. The woman usually does not stay overnight (outpatient) at the hospital or clinic, and receives external radiation 5 days a week for several weeks. This schedule helps protect healthy cells and tissue by spreading out the total dose of radiation. No radioactive materials are put into the body for external radiation therapy.  Internal radiation. In internal radiation therapy, tiny tubes containing a radioactive substance are inserted through the vagina and cervix, into the uterus, and left in place for a few days. The woman stays in the hospital during this treatment. To protect others from radiation exposure, the patient may not be able to have visitors or may have visitors only for a short period of time while the implant is in place. Once  the implant is removed, the woman has no radioactivity in her body.  Some patients need both external and internal radiation therapies. CHEMOTHERAPY Chemotherapy is not usually used for endometrial cancer of the uterus. However, with sarcoma of the uterus or of the fibroid, it may be used in combination with surgery. Chemotherapy may also be used with recurring sarcoma, and in patients who cannot have surgery. HORMONE THERAPY Hormonal therapy involves substances that prevent cancer cells from multiplying or growing by attaching to hormone receptors. This   causes changes in cancer cells. Before therapy begins, the caregiver may request a hormone receptor test. This special lab test of uterine tissue helps the caregiver learn if estrogen and progesterone receptors are present. If the tissue has receptors, the woman is more likely to respond to hormonal therapy.  Hormonal therapy is called a systemic therapy, because it can affect cancer cells throughout the body. Usually, hormonal therapy is a type of progesterone, taken as a pill or injection.  The doctor may use hormonal therapy for women with uterine cancer who are unable to have surgery or radiation therapy. Also, the doctor may give hormonal therapy to women with uterine cancer that has spread to the lungs or other distant sites. It is also given to women with uterine cancer that has come back.  Hormonal therapy can cause a number of side effects. Women taking progesterone may retain fluid, have an increased appetite, and gain weight. Women who are still menstruating may have changes in their periods.  Hormone therapy can be used in combination with surgery or radiation. HOME CARE INSTRUCTIONS   Maintain a normal weight with a healthy balanced diet and exercise.  If you have diabetes, high blood pressure, thyroid or gallbladder disease, keep them in control with your caregiver's treatment and recommendations.  Do not smoke.  Do not take  estrogen without taking progesterone with it, for menopausal symptoms.  Join a support group or get counseling, if you would like help dealing with your cancer.  If you are on hormone replacement therapy, see your caregiver as recommended, and be informed about the side effects of HRT.  Women with known risk factors should ask their caregiver what symptoms to look for and how often they should have an examination.  Keep your follow-up appointments and take your medicines as advised.  Write your questions down, and take them with you to your caregiver's appointments.  You may want another person to be with you for your appointments, so you do not miss any instructions. SEEK MEDICAL CARE IF:   You have any abnormal vaginal bleeding.  You are having menstrual periods at the age of 52 or older.  You have bleeding after sexual intercourse.  You are taking tomoxifen and develop vaginal bleeding.  Your stomach is growing, and you are not pregnant.  You have pain with sexual intercourse.  You have stomach or pelvis pain.  You have weight loss for no known reason.  You have pain or difficulty with urination. [facility]  Cancer Information Service (CIS) provides accurate, up-to-date information on cancer to patients and their families, health professionals, and the general public:  Phone: 1-800-4-CANCER (1-800-422-6237).  Internet: http://www.cancer.gov NCI's website contains complete information about cancer causes and prevention, screening and diagnosis, treatment and survivorship, clinical trials, statistics, funding, training, and employment opportunities, and the Institute and its programs. CLINICAL TRIALS A woman who is interested in being part of a clinical trial should talk with her caregiver. NCI's website (http://www.cancer.gov) provides general information about clinical trials. It also offers detailed information about specific ongoing studies of  uterine cancer by linking to PDQ, a cancer information database developed by the NCI. The Cancer Information Service at 1-800-4-CANCER can answer questions about cancer and provide information from the PDQ database. Document Released: 01/14/2005 Document Revised: 04/08/2011 Document Reviewed: 11/17/2008 Exit[employer] Patient Information 2013 [employer].   

## 2012-06-04 NOTE — Progress Notes (Signed)
Consult Note: Gyn-Onc  [name] 57 y.o. female  CC:  Chief Complaint  Patient presents with  . Endometrial Cancer    New patient    HPI: Patient is seen today in consultation at the request of Dr. Debroah Loop.  Patient is a 57 year old gravida 2 para 2Z2 spontaneous vaginal deliveries. She completed menopause and had her last regular cycles in December 2012. Beginning in December 2013 she would notice an occasional slight blood tinge when she wipes. There is no specific blood flow. There was a close the time of her last menstrual period she felt that this was most likely just part of menopause. However, it was persistent. She did have a slight amount of cramping and she went to see Dr. Debroah Loop for evaluation. She states she did have an ultrasound performed that revealed a the uterus to be 8.3 x 4.2 x 4.7 cm. The endometrium had a width of 16 mm and was clearly abnormally thickened. The right ovary measured 2.8 x 0.8 x 1.7 cm and had a normal appearance. The left ovary measured 1.9 x 2.1 x 1.1 cm. There is no pelvic fluid identified. Of note, on the ultrasound it did comment on a loss of endometrial myometrial interface in the right anterior lateral portion of the uterus with a highly vascular echogenic area appearing to extend into the myometrium from the endometrium and the appearance is worrisome for endometrial carcinoma with myometrial extension.  Endometrial biopsy revealed endometrial adenocarcinoma. The pathology, and stated that there was endometrial adenocarcinoma arising in a background of complex atypical hyperplasia. It appeared to be grade 1. She almost had to cancel her appointment today as her best friend husband died and 8 of a myocardial infarction of this morning.  Interval History:   Review of Systems: She does complain of occasional right lower quadrant pain other than the cramping she was experiencing before. There is no specific factors that make the pain worse or better.  It'll be persistent for about 3-4 days and then anticipates for a few weeks and then recurs. If not associated with any change in her bowel or bladder habits. She was also and for some soreness under her right arm. It has been evaluated with ultrasound mammogram and breast ultrasound and the workup has been negative. There is not been any clear etiology defined. Review of systems is negative for any nausea, vomiting, fevers, chills, chest pain, shortness of breath. She has any change in her bowel or bladder habits. She's never had a colonoscopy nor has she done stool guaiac cards. She is due for her mammogram.  Current Meds:  Outpatient Encounter Prescriptions as of 06/04/2012  Medication Sig Dispense Refill  . amLODipine-olmesartan (AZOR) 10-40 MG per tablet Take 1 tablet by mouth daily.      Marland Kitchen Lysine 500 MG CAPS Take by mouth.      . metoprolol succinate (TOPROL-XL) 100 MG 24 hr tablet Take 100 mg by mouth daily. Take with or immediately following a meal.      . Multiple Vitamins-Minerals (MULTIVITAMIN WITH MINERALS) tablet Take 1 tablet by mouth daily.      Marland Kitchen venlafaxine (EFFEXOR) 25 MG tablet Take 75 mg by mouth daily.       . vitamin E 400 UNIT capsule Take 400 Units by mouth daily.       No facility-administered encounter medications on file as of 06/04/2012.    Allergy:  Allergies  Allergen Reactions  . Cola (Syrup) Anaphylaxis  . Sulfa Antibiotics  Hives and Rash    Social Hx:   History   Social History  . Marital Status: Widowed    Spouse Name: N/A    Number of Children: N/A  . Years of Education: N/A   Occupational History  . Not on file.   Social History Main Topics  . Smoking status: Never Smoker   . Smokeless tobacco: Not on file  . Alcohol Use: 0.6 oz/week    1 Glasses of wine per week     Comment: occas  . Drug Use: No  . Sexually Active: No   Other Topics Concern  . Not on file   Social History Narrative  . No narrative on file    Past Surgical Hx: She had  a hiatal hernia care 2001 via laparotomy. She states her stomach was  "tacked" Past Surgical History  Procedure Laterality Date  . Appendectomy    . Btl    . Hiatal hernia repair  12/1999    Past Medical Hx:  Past Medical History  Diagnosis Date  . Hypertension   . Depression   . Anxiety     Family Hx:  Family History  Problem Relation Age of Onset  . Hypertension Mother   . Diabetes Father   . Cancer Father   . Hypertension Father   . Hypertension Sister   . Cancer Brother   . Hypertension Brother   . Cancer Paternal Aunt   . Cancer Maternal Grandmother   . Cancer Paternal Grandmother     Vitals:  Blood pressure 150/90, pulse 120, temperature 98.4 F (36.9 C), resp. rate 22, height 5\' 5"  (1.651 m), weight 217 lb 1.6 oz (98.476 kg), last menstrual period 05/08/2012.  Physical Exam:  Well-nourished well-developed female in no acute distress.  Neck: Supple, no lymphadenopathy, no thyromegaly.  Lungs: Clear to auscultation bilaterally.  Cardiovascular: Regular rate and rhythm.  Abdomen: Large vertical midline supraumbilical incision. Abdomen is obese, soft, nontender, nondistended. There is no palpable masses or hepatosplenomegaly. Exam is limited by habitus.  Groins: No lymphadenopathy.  Extremities: No edema.  Pelvic: Normal external female genitalia. The vagina slightly atrophic. The cervix is multiparous. There are several nabothian cyst. Bimanual examination the cervix is palpably normal. The corpus is of normal size shape and consistency. There is a questionable mass in the right adnexa there was never measuring approximately 3 cm. There is no of left adnexal masses. There is no nodularity.  Assessment/Plan: 57 year old with a clinical stage I grade 1 endometrial carcinoma arising and complex hyperplasia with atypia. I discussed with the patient that we would need to proceed with hysterectomy BSO and appropriate staging based on frozen section. We discussed  route of surgery. She does have a large vertical midline incision with a history of having her stomach "tacked". I do not believe that we can proceed with laparoscopy in the left upper quadrant with Plummer's point insertion. I will be related to proceed with a diagnostic laparoscopy via the umbilicus. Pending the amount of adhesive disease, we can still consider performing her surgery in a minimally invasive fashion. She understands if she has a significant amount of adhesive disease we'll proceed with immediate laparotomy for her surgery.  Risks of surgery including but not limited to bleeding, infection, injury to surrounding organs most notably bowel was discussed with her prior surgeries, and thromboembolic disease were discussed. She understands if she is a laparotomy 20 to be discharged home on anticoagulants for 2-4 weeks postoperatively.  Her questions were elicited  in answer to her satisfaction. We are tentatively scheduling her surgery for may 27th. She was given patient instructions above endometrial cancer as well as hysterectomy. She will have a preoperative appointment. She has our card and will contact us if she has any questions to  [facility] A., MD 06/04/2012, 10:19 AM

## 2012-06-12 ENCOUNTER — Encounter (HOSPITAL_COMMUNITY): Payer: Self-pay | Admitting: Pharmacy Technician

## 2012-06-18 ENCOUNTER — Other Ambulatory Visit (HOSPITAL_COMMUNITY): Payer: Self-pay | Admitting: Gynecologic Oncology

## 2012-06-18 NOTE — Patient Instructions (Addendum)
20 [name]  06/18/2012   Your procedure is scheduled on: 06-23-2012  Report to [facility] at  915 AM.  Call this number if you have problems the morning of surgery [phone]   Remember:   Do not eat food  :After Midnight Sunday night.             Clear liquids all day Monday 06-22-2012, no liquids after midnight Monday night.     Take these medicines the morning of surgery with A SIP OF WATER: metoprolol succinate, alprazolam if needed, venlafaxine                                SEE [facility] PREPARING FOR SURGERY SHEET   Do not wear jewelry, make-up or nail polish.  Do not wear lotions, powders, or perfumes. You may wear deodorant.   Men may shave face and neck.  Do not bring valuables to the hospital.  Contacts, dentures or bridgework may not be worn into surgery.  Leave suitcase in the car. After surgery it may be brought to your room.  For patients admitted to the hospital, checkout time is 11:00 AM the day of discharge.   Patients discharged the day of surgery will not be allowed to drive home.  Name and phone number of your driver:  Special Instructions: N/A   Please read over the following fact sheets that you were given: MRSA Information, blood fact sheet, incentive spirometer fact sheet  Call Cain Sieve RN pre op nurse if needed [phone]    FAILURE TO FOLLOW THESE INSTRUCTIONS MAY RESULT IN THE CANCELLATION OF YOUR SURGERY. PATIENT SIGNATURE___________________________________________

## 2012-06-19 ENCOUNTER — Encounter (HOSPITAL_COMMUNITY): Payer: Self-pay

## 2012-06-19 ENCOUNTER — Encounter (HOSPITAL_COMMUNITY)
Admission: RE | Admit: 2012-06-19 | Discharge: 2012-06-19 | Disposition: A | Discharge status: Home / Self Care | Payer: BC Managed Care – PPO | Source: Ambulatory Visit | Attending: Gynecologic Oncology | Admitting: Gynecologic Oncology

## 2012-06-19 ENCOUNTER — Ambulatory Visit (HOSPITAL_COMMUNITY)
Admission: RE | Admit: 2012-06-19 | Discharge: 2012-06-19 | Disposition: A | Discharge status: Home / Self Care | Payer: BC Managed Care – PPO | Source: Ambulatory Visit | Attending: Gynecologic Oncology | Admitting: Gynecologic Oncology

## 2012-06-19 DIAGNOSIS — Z01812 Encounter for preprocedural laboratory examination: Secondary | ICD-10-CM | POA: Insufficient documentation

## 2012-06-19 DIAGNOSIS — C549 Malignant neoplasm of corpus uteri, unspecified: Secondary | ICD-10-CM | POA: Insufficient documentation

## 2012-06-19 DIAGNOSIS — Z01818 Encounter for other preprocedural examination: Secondary | ICD-10-CM | POA: Insufficient documentation

## 2012-06-19 DIAGNOSIS — I10 Essential (primary) hypertension: Secondary | ICD-10-CM | POA: Insufficient documentation

## 2012-06-19 DIAGNOSIS — Z01810 Encounter for preprocedural cardiovascular examination: Secondary | ICD-10-CM | POA: Insufficient documentation

## 2012-06-19 DIAGNOSIS — R9431 Abnormal electrocardiogram [ECG] [EKG]: Secondary | ICD-10-CM | POA: Insufficient documentation

## 2012-06-19 DIAGNOSIS — Z0183 Encounter for blood typing: Secondary | ICD-10-CM | POA: Insufficient documentation

## 2012-06-19 HISTORY — DX: Malignant (primary) neoplasm, unspecified: C80.1

## 2012-06-19 HISTORY — DX: Benign neoplasm of unspecified kidney: D30.00

## 2012-06-19 HISTORY — DX: Other complications of anesthesia, initial encounter: T88.59XA

## 2012-06-19 HISTORY — DX: Adverse effect of unspecified anesthetic, initial encounter: T41.45XA

## 2012-06-19 LAB — CBC WITH DIFFERENTIAL/PLATELET
Eosinophils Relative: 3 % (ref 0–5)
Hemoglobin: 14.5 g/dL (ref 12.0–15.0)
Lymphocytes Relative: 29 % (ref 12–46)
Lymphs Abs: 2.3 10*3/uL (ref 0.7–4.0)
MCV: 88.9 fL (ref 78.0–100.0)
Platelets: 331 10*3/uL (ref 150–400)
RBC: 4.77 MIL/uL (ref 3.87–5.11)
WBC: 7.7 10*3/uL (ref 4.0–10.5)

## 2012-06-19 LAB — COMPREHENSIVE METABOLIC PANEL
ALT: 12 U/L (ref 0–35)
Alkaline Phosphatase: 70 U/L (ref 39–117)
CO2: 28 mEq/L (ref 19–32)
Calcium: 9.9 mg/dL (ref 8.4–10.5)
GFR calc Af Amer: >90 mL/min (ref >90)
GFR calc non Af Amer: >90 mL/min (ref >90)
Glucose, Bld: 116 mg/dL — ABNORMAL HIGH (ref 70–99)
Potassium: 4.3 mEq/L (ref 3.5–5.1)
Sodium: 141 mEq/L (ref 135–145)

## 2012-06-19 LAB — HCG, SERUM, QUALITATIVE: Preg, Serum: NEGATIVE

## 2012-06-23 ENCOUNTER — Encounter (HOSPITAL_COMMUNITY): Payer: Self-pay | Admitting: *Deleted

## 2012-06-23 ENCOUNTER — Encounter (HOSPITAL_COMMUNITY): Payer: Self-pay | Admitting: Anesthesiology

## 2012-06-23 ENCOUNTER — Ambulatory Visit (HOSPITAL_COMMUNITY)
Admission: RE | Admit: 2012-06-23 | Discharge: 2012-06-24 | Disposition: A | Discharge status: Home / Self Care | Payer: BC Managed Care – PPO | Source: Ambulatory Visit | Attending: Obstetrics & Gynecology | Admitting: Obstetrics & Gynecology

## 2012-06-23 ENCOUNTER — Ambulatory Visit (HOSPITAL_COMMUNITY): Payer: BC Managed Care – PPO | Admitting: Anesthesiology

## 2012-06-23 ENCOUNTER — Encounter (HOSPITAL_COMMUNITY)
Admission: RE | Disposition: A | Discharge status: Home / Self Care | Payer: Self-pay | Source: Ambulatory Visit | Attending: Obstetrics & Gynecology

## 2012-06-23 DIAGNOSIS — I10 Essential (primary) hypertension: Secondary | ICD-10-CM | POA: Insufficient documentation

## 2012-06-23 DIAGNOSIS — C541 Malignant neoplasm of endometrium: Secondary | ICD-10-CM | POA: Diagnosis present

## 2012-06-23 DIAGNOSIS — C549 Malignant neoplasm of corpus uteri, unspecified: Secondary | ICD-10-CM | POA: Insufficient documentation

## 2012-06-23 DIAGNOSIS — Z8542 Personal history of malignant neoplasm of other parts of uterus: Secondary | ICD-10-CM | POA: Diagnosis present

## 2012-06-23 DIAGNOSIS — Z79899 Other long term (current) drug therapy: Secondary | ICD-10-CM | POA: Insufficient documentation

## 2012-06-23 DIAGNOSIS — Z780 Asymptomatic menopausal state: Secondary | ICD-10-CM | POA: Insufficient documentation

## 2012-06-23 DIAGNOSIS — N72 Inflammatory disease of cervix uteri: Secondary | ICD-10-CM | POA: Insufficient documentation

## 2012-06-23 HISTORY — PX: ROBOTIC ASSISTED TOTAL HYSTERECTOMY WITH BILATERAL SALPINGO OOPHERECTOMY: SHX6086

## 2012-06-23 SURGERY — ROBOTIC ASSISTED TOTAL HYSTERECTOMY WITH BILATERAL SALPINGO OOPHORECTOMY
Anesthesia: General | Site: Abdomen | Laterality: Bilateral | Wound class: Clean Contaminated

## 2012-06-23 MED ORDER — OXYCODONE-ACETAMINOPHEN 5-325 MG PO TABS: 1.0000 | ORAL_TABLET | ORAL | Status: DC | PRN

## 2012-06-23 MED ORDER — HYDROMORPHONE HCL PF 1 MG/ML IJ SOLN: 0.5000 mg | INTRAMUSCULAR | Status: DC | PRN

## 2012-06-23 MED ORDER — LACTATED RINGERS IV SOLN
INTRAVENOUS | Status: DC | PRN
  Administered 2012-06-23 (×2): via INTRAVENOUS

## 2012-06-23 MED ORDER — KCL IN DEXTROSE-NACL 20-5-0.45 MEQ/L-%-% IV SOLN
INTRAVENOUS | Status: DC
  Administered 2012-06-23 (×2): via INTRAVENOUS
  Filled 2012-06-23 (×4): qty 1000

## 2012-06-23 MED ORDER — AMLODIPINE BESYLATE 10 MG PO TABS
10.0000 mg | ORAL_TABLET | Freq: Once | ORAL | Status: AC
  Administered 2012-06-23: 10 mg via ORAL
  Filled 2012-06-23: qty 1

## 2012-06-23 MED ORDER — AMLODIPINE-OLMESARTAN 10-40 MG PO TABS: 1.0000 | ORAL_TABLET | Freq: Every morning | ORAL | Status: DC

## 2012-06-23 MED ORDER — KETOROLAC TROMETHAMINE 30 MG/ML IJ SOLN
30.0000 mg | Freq: Four times a day (QID) | INTRAMUSCULAR | Status: DC
  Filled 2012-06-23 (×4): qty 1

## 2012-06-23 MED ORDER — FENTANYL CITRATE 0.05 MG/ML IJ SOLN: 25.0000 ug | INTRAMUSCULAR | Status: DC | PRN

## 2012-06-23 MED ORDER — ACETAMINOPHEN 10 MG/ML IV SOLN
INTRAVENOUS | Status: AC
  Filled 2012-06-23: qty 100

## 2012-06-23 MED ORDER — KETOROLAC TROMETHAMINE 30 MG/ML IJ SOLN: 15.0000 mg | Freq: Once | INTRAMUSCULAR | Status: DC | PRN

## 2012-06-23 MED ORDER — VENLAFAXINE HCL ER 75 MG PO CP24
75.0000 mg | ORAL_CAPSULE | Freq: Every morning | ORAL | Status: DC
  Administered 2012-06-24: 75 mg via ORAL
  Filled 2012-06-23: qty 1

## 2012-06-23 MED ORDER — KETOROLAC TROMETHAMINE 30 MG/ML IJ SOLN
30.0000 mg | Freq: Four times a day (QID) | INTRAMUSCULAR | Status: DC
  Administered 2012-06-23 – 2012-06-24 (×3): 30 mg via INTRAVENOUS
  Filled 2012-06-23 (×8): qty 1

## 2012-06-23 MED ORDER — ONDANSETRON HCL 4 MG/2ML IJ SOLN: 4.0000 mg | Freq: Four times a day (QID) | INTRAMUSCULAR | Status: DC | PRN

## 2012-06-23 MED ORDER — KCL IN DEXTROSE-NACL 20-5-0.45 MEQ/L-%-% IV SOLN
INTRAVENOUS | Status: AC
  Filled 2012-06-23: qty 1000

## 2012-06-23 MED ORDER — SUFENTANIL CITRATE 50 MCG/ML IV SOLN
INTRAVENOUS | Status: DC | PRN
  Administered 2012-06-23: 10 ug via INTRAVENOUS
  Administered 2012-06-23: 20 ug via INTRAVENOUS
  Administered 2012-06-23: 10 ug via INTRAVENOUS

## 2012-06-23 MED ORDER — ONDANSETRON HCL 4 MG/2ML IJ SOLN
INTRAMUSCULAR | Status: DC | PRN
  Administered 2012-06-23: 4 mg via INTRAVENOUS

## 2012-06-23 MED ORDER — CISATRACURIUM BESYLATE (PF) 10 MG/5ML IV SOLN
INTRAVENOUS | Status: DC | PRN
  Administered 2012-06-23: 10 mg via INTRAVENOUS
  Administered 2012-06-23: 6 mg via INTRAVENOUS

## 2012-06-23 MED ORDER — ACETAMINOPHEN 10 MG/ML IV SOLN
INTRAVENOUS | Status: DC | PRN
  Administered 2012-06-23: 1000 mg via INTRAVENOUS

## 2012-06-23 MED ORDER — ZOLPIDEM TARTRATE 5 MG PO TABS: 5.0000 mg | ORAL_TABLET | Freq: Every evening | ORAL | Status: DC | PRN

## 2012-06-23 MED ORDER — NEOSTIGMINE METHYLSULFATE 1 MG/ML IJ SOLN
INTRAMUSCULAR | Status: DC | PRN
  Administered 2012-06-23: 4 mg via INTRAVENOUS

## 2012-06-23 MED ORDER — CEFAZOLIN SODIUM-DEXTROSE 2-3 GM-% IV SOLR
2.0000 g | INTRAVENOUS | Status: AC
  Administered 2012-06-23: 2 g via INTRAVENOUS

## 2012-06-23 MED ORDER — STERILE WATER FOR IRRIGATION IR SOLN
Status: DC | PRN
  Administered 2012-06-23: 3000 mL

## 2012-06-23 MED ORDER — LACTATED RINGERS IR SOLN
Status: DC | PRN
  Administered 2012-06-23: 1000 mL

## 2012-06-23 MED ORDER — ONDANSETRON HCL 4 MG PO TABS: 4.0000 mg | ORAL_TABLET | Freq: Four times a day (QID) | ORAL | Status: DC | PRN

## 2012-06-23 MED ORDER — HYDROMORPHONE HCL PF 1 MG/ML IJ SOLN: 0.2500 mg | INTRAMUSCULAR | Status: DC | PRN

## 2012-06-23 MED ORDER — ALPRAZOLAM 0.25 MG PO TABS: 0.1250 mg | ORAL_TABLET | Freq: Every day | ORAL | Status: DC | PRN

## 2012-06-23 MED ORDER — AMLODIPINE BESYLATE 10 MG PO TABS
10.0000 mg | ORAL_TABLET | Freq: Every day | ORAL | Status: DC
  Administered 2012-06-24: 10 mg via ORAL
  Filled 2012-06-23: qty 1

## 2012-06-23 MED ORDER — PROPOFOL 10 MG/ML IV BOLUS
INTRAVENOUS | Status: DC | PRN
  Administered 2012-06-23: 200 mg via INTRAVENOUS

## 2012-06-23 MED ORDER — SUCCINYLCHOLINE CHLORIDE 20 MG/ML IJ SOLN
INTRAMUSCULAR | Status: DC | PRN
  Administered 2012-06-23: 100 mg via INTRAVENOUS

## 2012-06-23 MED ORDER — PROMETHAZINE HCL 25 MG/ML IJ SOLN: 6.2500 mg | INTRAMUSCULAR | Status: DC | PRN

## 2012-06-23 MED ORDER — MIDAZOLAM HCL 5 MG/5ML IJ SOLN
INTRAMUSCULAR | Status: DC | PRN
  Administered 2012-06-23: 2 mg via INTRAVENOUS

## 2012-06-23 MED ORDER — GLYCOPYRROLATE 0.2 MG/ML IJ SOLN
INTRAMUSCULAR | Status: DC | PRN
  Administered 2012-06-23: .6 mg via INTRAVENOUS

## 2012-06-23 MED ORDER — DEXAMETHASONE SODIUM PHOSPHATE 10 MG/ML IJ SOLN
INTRAMUSCULAR | Status: DC | PRN
  Administered 2012-06-23: 10 mg via INTRAVENOUS

## 2012-06-23 MED ORDER — METOPROLOL TARTRATE 100 MG PO TABS
100.0000 mg | ORAL_TABLET | Freq: Once | ORAL | Status: DC
  Filled 2012-06-23: qty 1

## 2012-06-23 MED ORDER — IRBESARTAN 300 MG PO TABS
300.0000 mg | ORAL_TABLET | Freq: Every day | ORAL | Status: DC
  Administered 2012-06-24: 300 mg via ORAL
  Filled 2012-06-23: qty 1

## 2012-06-23 MED ORDER — LIDOCAINE HCL (CARDIAC) 20 MG/ML IV SOLN
INTRAVENOUS | Status: DC | PRN
  Administered 2012-06-23: 100 mg via INTRAVENOUS

## 2012-06-23 MED ORDER — CEFAZOLIN SODIUM-DEXTROSE 2-3 GM-% IV SOLR
INTRAVENOUS | Status: AC
  Filled 2012-06-23: qty 50

## 2012-06-23 SURGICAL SUPPLY — 48 items
BENZOIN TINCTURE PRP APPL 2/3 (GAUZE/BANDAGES/DRESSINGS) ×2 IMPLANT
CHLORAPREP W/TINT 26ML (MISCELLANEOUS) ×2 IMPLANT
CLOTH BEACON ORANGE TIMEOUT ST (SAFETY) ×2 IMPLANT
CORD HIGH FREQUENCY UNIPOLAR (ELECTROSURGICAL) ×2 IMPLANT
CORDS BIPOLAR (ELECTRODE) ×2 IMPLANT
COVER MAYO STAND STRL (DRAPES) IMPLANT
COVER SURGICAL LIGHT HANDLE (MISCELLANEOUS) ×2 IMPLANT
COVER TIP SHEARS 8 DVNC (MISCELLANEOUS) ×2 IMPLANT
COVER TIP SHEARS 8MM DA VINCI (MISCELLANEOUS) ×2
DRAPE LG THREE QUARTER DISP (DRAPES) ×4 IMPLANT
DRAPE SURG IRRIG POUCH 19X23 (DRAPES) ×2 IMPLANT
DRAPE TABLE BACK 44X90 PK DISP (DRAPES) ×4 IMPLANT
DRAPE UTILITY XL STRL (DRAPES) ×2 IMPLANT
DRAPE WARM FLUID 44X44 (DRAPE) ×2 IMPLANT
DRSG TEGADERM 6X8 (GAUZE/BANDAGES/DRESSINGS) ×4 IMPLANT
ELECT REM PT RETURN 9FT ADLT (ELECTROSURGICAL) ×2
ELECTRODE REM PT RTRN 9FT ADLT (ELECTROSURGICAL) ×1 IMPLANT
GLOVE BIO SURGEON STRL SZ 6.5 (GLOVE) ×4 IMPLANT
GLOVE BIO SURGEON STRL SZ7.5 (GLOVE) ×4 IMPLANT
GLOVE BIOGEL PI IND STRL 7.0 (GLOVE) ×2 IMPLANT
GLOVE BIOGEL PI INDICATOR 7.0 (GLOVE) ×2
GOWN PREVENTION PLUS XLARGE (GOWN DISPOSABLE) ×4 IMPLANT
GOWN STRL NON-REIN LRG LVL3 (GOWN DISPOSABLE) ×4 IMPLANT
HOLDER FOLEY CATH W/STRAP (MISCELLANEOUS) ×2 IMPLANT
KIT ACCESSORY DA VINCI DISP (KITS) ×1
KIT ACCESSORY DVNC DISP (KITS) ×1 IMPLANT
MANIPULATOR UTERINE 4.5 ZUMI (MISCELLANEOUS) ×2 IMPLANT
OCCLUDER COLPOPNEUMO (BALLOONS) ×2 IMPLANT
PACK LAPAROSCOPY W LONG (CUSTOM PROCEDURE TRAY) ×2 IMPLANT
POUCH SPECIMEN RETRIEVAL 10MM (ENDOMECHANICALS) ×2 IMPLANT
SET TUBE IRRIG SUCTION NO TIP (IRRIGATION / IRRIGATOR) ×2 IMPLANT
SHEET LAVH (DRAPES) ×2 IMPLANT
SOLUTION ELECTROLUBE (MISCELLANEOUS) ×2 IMPLANT
SPONGE LAP 18X18 X RAY DECT (DISPOSABLE) IMPLANT
STRIP CLOSURE SKIN 1/2X4 (GAUZE/BANDAGES/DRESSINGS) ×2 IMPLANT
SUT VIC AB 0 CT1 27 (SUTURE)
SUT VIC AB 0 CT1 27XBRD ANTBC (SUTURE) IMPLANT
SUT VIC AB 4-0 PS2 27 (SUTURE) ×4 IMPLANT
SUT VICRYL 0 UR6 27IN ABS (SUTURE) ×4 IMPLANT
SYR 50ML LL SCALE MARK (SYRINGE) ×2 IMPLANT
SYR BULB IRRIGATION 50ML (SYRINGE) IMPLANT
TOWEL OR 17X26 10 PK STRL BLUE (TOWEL DISPOSABLE) ×4 IMPLANT
TRAP SPECIMEN MUCOUS 40CC (MISCELLANEOUS) IMPLANT
TRAY FOLEY CATH 14FRSI W/METER (CATHETERS) ×2 IMPLANT
TROCAR 12M 150ML BLUNT (TROCAR) ×2 IMPLANT
TROCAR XCEL 12X100 BLDLESS (ENDOMECHANICALS) ×2 IMPLANT
TROCAR XCEL BLADELESS 5X75MML (TROCAR) ×2 IMPLANT
WATER STERILE IRR 1500ML POUR (IV SOLUTION) IMPLANT

## 2012-06-23 NOTE — H&P (View-Only) (Signed)
Consult Note: Gyn-Onc  [name] 57 y.o. female  CC:  Chief Complaint  Patient presents with  . Endometrial Cancer    New patient    HPI: Patient is seen today in consultation at the request of Dr. Arnold.  Patient is a 57-year-old gravida 2 para 2Z2 spontaneous vaginal deliveries. She completed menopause and had her last regular cycles in December 2012. Beginning in December 2013 she would notice an occasional slight blood tinge when she wipes. There is no specific blood flow. There was a close the time of her last menstrual period she felt that this was most likely just part of menopause. However, it was persistent. She did have a slight amount of cramping and she went to see Dr. Arnold for evaluation. She states she did have an ultrasound performed that revealed a the uterus to be 8.3 x 4.2 x 4.7 cm. The endometrium had a width of 16 mm and was clearly abnormally thickened. The right ovary measured 2.8 x 0.8 x 1.7 cm and had a normal appearance. The left ovary measured 1.9 x 2.1 x 1.1 cm. There is no pelvic fluid identified. Of note, on the ultrasound it did comment on a loss of endometrial myometrial interface in the right anterior lateral portion of the uterus with a highly vascular echogenic area appearing to extend into the myometrium from the endometrium and the appearance is worrisome for endometrial carcinoma with myometrial extension.  Endometrial biopsy revealed endometrial adenocarcinoma. The pathology, and stated that there was endometrial adenocarcinoma arising in a background of complex atypical hyperplasia. It appeared to be grade 1. She almost had to cancel her appointment today as her best friend husband died and 8 of a myocardial infarction of this morning.  57-year-old Interval History:   Review of Systems: She does complain of occasional right lower quadrant pain other than the cramping she was experiencing before. There is no specific factors that make the pain worse or better.  It'll be persistent for about 3-4 days and then anticipates for a few weeks and then recurs. If not associated with any change in her bowel or bladder habits. She was also and for some soreness under her right arm. It has been evaluated with ultrasound mammogram and breast ultrasound and the workup has been negative. There is not been any clear etiology defined. Review of systems is negative for any nausea, vomiting, fevers, chills, chest pain, shortness of breath. She has any change in her bowel or bladder habits. She's never had a colonoscopy nor has she done stool guaiac cards. She is due for her mammogram.  Current Meds:  Outpatient Encounter Prescriptions as of 06/04/2012  Medication Sig Dispense Refill  . amLODipine-olmesartan (AZOR) 10-40 MG per tablet Take 1 tablet by mouth daily.      . Lysine 500 MG CAPS Take by mouth.      . metoprolol succinate (TOPROL-XL) 100 MG 24 hr tablet Take 100 mg by mouth daily. Take with or immediately following a meal.      . Multiple Vitamins-Minerals (MULTIVITAMIN WITH MINERALS) tablet Take 1 tablet by mouth daily.      . venlafaxine (EFFEXOR) 25 MG tablet Take 75 mg by mouth daily.       . vitamin E 400 UNIT capsule Take 400 Units by mouth daily.       No facility-administered encounter medications on file as of 06/04/2012.    Allergy:  Allergies  Allergen Reactions  . Cola (Syrup) Anaphylaxis  . Sulfa Antibiotics   Hives and Rash    Social Hx:   History   Social History  . Marital Status: Widowed    Spouse Name: N/A    Number of Children: N/A  . Years of Education: N/A   Occupational History  . Not on file.   Social History Main Topics  . Smoking status: Never Smoker   . Smokeless tobacco: Not on file  . Alcohol Use: 0.6 oz/week    1 Glasses of wine per week     Comment: occas  . Drug Use: No  . Sexually Active: No   Other Topics Concern  . Not on file   Social History Narrative  . No narrative on file    Past Surgical Hx: She had  a hiatal hernia care 2001 via laparotomy. She states her stomach was  "tacked" Past Surgical History  Procedure Laterality Date  . Appendectomy    . Btl    . Hiatal hernia repair  12/1999    Past Medical Hx:  Past Medical History  Diagnosis Date  . Hypertension   . Depression   . Anxiety     Family Hx:  Family History  Problem Relation Age of Onset  . Hypertension Mother   . Diabetes Father   . Cancer Father   . Hypertension Father   . Hypertension Sister   . Cancer Brother   . Hypertension Brother   . Cancer Paternal Aunt   . Cancer Maternal Grandmother   . Cancer Paternal Grandmother     Vitals:  Blood pressure 150/90, pulse 120, temperature 98.4 F (36.9 C), resp. rate 22, height 5' 5" (1.651 m), weight 217 lb 1.6 oz (98.476 kg), last menstrual period 05/08/2012.  Physical Exam:  Well-nourished well-developed female in no acute distress.  Neck: Supple, no lymphadenopathy, no thyromegaly.  Lungs: Clear to auscultation bilaterally.  Cardiovascular: Regular rate and rhythm.  Abdomen: Large vertical midline supraumbilical incision. Abdomen is obese, soft, nontender, nondistended. There is no palpable masses or hepatosplenomegaly. Exam is limited by habitus.  Groins: No lymphadenopathy.  Extremities: No edema.  Pelvic: Normal external female genitalia. The vagina slightly atrophic. The cervix is multiparous. There are several nabothian cyst. Bimanual examination the cervix is palpably normal. The corpus is of normal size shape and consistency. There is a questionable mass in the right adnexa there was never measuring approximately 3 cm. There is no of left adnexal masses. There is no nodularity.  Assessment/Plan: 56-year-old with a clinical stage I grade 1 endometrial carcinoma arising and complex hyperplasia with atypia. I discussed with the patient that we would need to proceed with hysterectomy BSO and appropriate staging based on frozen section. We discussed  route of surgery. She does have a large vertical midline incision with a history of having her stomach "tacked". I do not believe that we can proceed with laparoscopy in the left upper quadrant with Plummer's point insertion. I will be related to proceed with a diagnostic laparoscopy via the umbilicus. Pending the amount of adhesive disease, we can still consider performing her surgery in a minimally invasive fashion. She understands if she has a significant amount of adhesive disease we'll proceed with immediate laparotomy for her surgery.  Risks of surgery including but not limited to bleeding, infection, injury to surrounding organs most notably bowel was discussed with her prior surgeries, and thromboembolic disease were discussed. She understands if she is a laparotomy 20 to be discharged home on anticoagulants for 2-4 weeks postoperatively.  Her questions were elicited   in answer to her satisfaction. We are tentatively scheduling her surgery for may 27th. She was given patient instructions above endometrial cancer as well as hysterectomy. She will have a preoperative appointment. She has our card and will contact us if she has any questions to  Rolf Fells A., MD 06/04/2012, 10:19 AM  

## 2012-06-23 NOTE — Anesthesia Preprocedure Evaluation (Signed)
Anesthesia Evaluation  Patient identified by MRN, date of birth, ID band Patient awake    Reviewed: Allergy & Precautions, H&P , NPO status , Patient's Chart, lab work & pertinent test results  Airway Mallampati: II TM Distance: >3 FB Neck ROM: Full    Dental no notable dental hx.    Pulmonary neg pulmonary ROS,  breath sounds clear to auscultation  Pulmonary exam normal       Cardiovascular hypertension, Rhythm:Regular Rate:Normal     Neuro/Psych Anxiety Depression negative neurological ROS     GI/Hepatic negative GI ROS, Neg liver ROS,   Endo/Other  Morbid obesity  Renal/GU negative Renal ROS  negative genitourinary   Musculoskeletal negative musculoskeletal ROS (+)   Abdominal   Peds negative pediatric ROS (+)  Hematology negative hematology ROS (+)   Anesthesia Other Findings   Reproductive/Obstetrics negative OB ROS                           Anesthesia Physical Anesthesia Plan  ASA: II  Anesthesia Plan: General   Post-op Pain Management:    Induction: Intravenous  Airway Management Planned: Oral ETT  Additional Equipment:   Intra-op Plan:   Post-operative Plan: Extubation in OR  Informed Consent: I have reviewed the patients History and Physical, chart, labs and discussed the procedure including the risks, benefits and alternatives for the proposed anesthesia with the patient or authorized representative who has indicated his/her understanding and acceptance.   Dental advisory given  Plan Discussed with: CRNA and Surgeon  Anesthesia Plan Comments:         Anesthesia Quick Evaluation

## 2012-06-23 NOTE — Transfer of Care (Signed)
Immediate Anesthesia Transfer of Care Note  Patient: [name]  Procedure(s) Performed: Procedure(s): ROBOTIC ASSISTED TOTAL HYSTERECTOMY WITH BILATERAL SALPINGO OOPHORECTOMY  LYMPH NODES, POSSIBLE LAPAROTOMY  (Bilateral)  Patient Location: PACU  Anesthesia Type:General  Level of Consciousness: awake, alert  and oriented  Airway & Oxygen Therapy: Patient Spontanous Breathing and Patient connected to face mask oxygen  Post-op Assessment: Report given to PACU RN and Post -op Vital signs reviewed and stable  Post vital signs: Reviewed and stable  Complications: No apparent anesthesia complications

## 2012-06-23 NOTE — Anesthesia Procedure Notes (Signed)
Procedure Name: Intubation Date/Time: 06/23/2012 11:24 AM Performed by: Leroy Libman L Patient Re-evaluated:Patient Re-evaluated prior to inductionOxygen Delivery Method: Circle system utilized Preoxygenation: Pre-oxygenation with 100% oxygen Intubation Type: IV induction Ventilation: Mask ventilation without difficulty and Oral airway inserted - appropriate to patient size Laryngoscope Size: Hyacinth Meeker and 2 Grade View: Grade I Tube type: Oral Tube size: 7.5 mm Number of attempts: 1 Airway Equipment and Method: Stylet Placement Confirmation: ETT inserted through vocal cords under direct vision,  positive ETCO2 and breath sounds checked- equal and bilateral Secured at: 21 cm Tube secured with: Tape Dental Injury: Teeth and Oropharynx as per pre-operative assessment

## 2012-06-23 NOTE — Op Note (Signed)
PATIENT: [name] DATE OF BIRTH: [birth year] ENCOUNTER DATE: 06/23/2012   Preop Diagnosis: Grade 1 endometrioid adenocarcinoma.   Postoperative Diagnosis: same.   Surgery: Total robotic hysterectomy bilateral salpingo-oophorectomy, bilateral pelvic lymph node dissection.   Surgeons:  Bernita Buffy. Duard Brady, MD; Antionette Char, MD   Assistant: Telford Nab   Anesthesia: General   Estimated blood loss: 25 ml   IVF: 1700 ml   Urine output: 100 ml   Complications: None   Pathology: Uterus, cervix, bilateral tubes and ovaries, bilateral pelvic lymph nodes to pathology.   Operative findings: Globular uterus.  Adhesive disease of the rectosigmoid colon to the left pelvic sidewall. Frozen section revealed a grade 1 endometrial adenocarcinoma with minimal myometrial invasion.   Procedure: The patient was identified in the preoperative holding area. Informed consent was signed on the chart. Patient was seen history was reviewed and exam was performed.   The patient was then taken to the operating room and placed in the supine position with SCD hose on. General anesthesia was then induced without difficulty. She was then placed in the dorsolithotomy position. Her arms were tucked at her side with appropriate precautions on the gel pad. Shoulder blocks were then placed in the usual fashion with appropriate precautions. A OG-tube was placed to suction. First timeout was performed to confirm the patient procedure, antibiotic,  allergy status, estimated estimated blood loss, and OR time. The perineum was then prepped in the usual fashion with Betadine. A 14 French Foley was inserted into the bladder under sterile conditions. A sterile speculum was placed in the vagina. The cervix was without lesions. The cervix was grasped with a single-tooth tenaculum. The dilator without difficulty. A ZUMI with a large Koe ring was placed without difficulty. The abdomen was then prepped with 2 Chlor prep sponges per  protocol.   Patient was then draped after the prep was dried. Second timeout was performed to confirm the above. After again confirming OG tube placement and it was to suction. A stab-wound was made in the umbilicus and using a 5 mm visible operative port we entered the abdomen. The abdomen was insufflated. At this point all points during the procedure the patient's intra-abdominal pressure was not increased over 15 mm of mercury. After insufflation was complete, the patient was placed in deep Trendelenburg position. We inspected the left upper quadrant and located an area free of adhesions about 3 cm below the costal margin just slightly left of the mid-clavicular line.  A 10/12 port was placed under direct visualization for the assistant port.  The umbilical port was converted to a 10/12 port and bilateral robotic ports were marked 10 cm from the midline incision at approximately 0 angle. Under direct visualization each of the trochars was placed into the abdomen.  The fourth arm was cut about 2 cm medial and superior to the ACIS and a trocar was placed under direct visualization. The small bowel was folded on its mesentery to allow visualization to the pelvis. After assuring adequate visualization, the robot was then docked in the usual fashion. Under direct visualization the robotic instruments replaced.   The adhesive disease of the recto-sigmoid colon was dissected free from the left pelvic sidewall to allow visualization of the left adnexa. The round ligament on the patient's left side was transected with monopolar cautery. The anterior and posterior leaves of the broad ligament were then taken down in the usual fashion. The ureter was identified on the medial leaf of the broad ligament. A  window was made between the IP and the ureter. The IP was coagulated with bipolar cautery and transected. The posterior leaf of the broad ligament was taken down to the level of the KOH ring. The bladder flap was  created using meticulous dissection and pinpoint cautery. The uterine vessels were coagulated with bipolar cautery. The uterine vessels were then transected and the C loop was created. The same procedure was performed on the patient's right side.   The pneumo-occulder in the vagina was then insufflated. The colpotomy was then created in the usual fashion. The specimen was then delivered to the vagina and sent for frozen section. Our attention was then drawn to opening the paravesical space on her right side the perirectal space was also opened. The obturator nerve was identified. The nodal bundle extending over the external iliac artery down to the external iliac vein was taken down using sharp dissection and monopolar cautery. The genitofemoral nerve was identified and spared. We continued our dissection down to the level of the obturator nerve. The nodal bundle superior to the obturator nerve was taken. All pedicles were noted to be hemostatic the ureter was noted to be well medial of the area of dissection. The nodal bundle was then placed and an Endo catch bag. The same procedure was performed on the patient's left side however just prior to removing the obturator nodes, frozen section returned as minimal myometrial invasion of grade 1 lesion. The decision was made to stop the procedure is no further lymphadenectomy was indicated. The nodal bundles were removed through the vagina.  The vaginal cuff was closed with a running 0 Vicryl on CT 1 suture. The abdomen and pelvis were copiously irrigated and noted to be hemostatic. The robotic instruments were removed under direct visualization as were the robotic trochars. The pneumoperitoneum was removed. The patient was then taken out of the Trendelenburg position. Using of 0 Vicryl on a UR 6 needle the midline port fascia was closed after grasping it with Allis clamps and the subcutaneous tissues of the port in the left upper quadrant ports were reapproximated.  The skin was closed using 4-0 Vicryl. Steri-Strips and benzoin were applied. The pneumo occluded balloon was removed from the vagina. The vagina was swabbed and noted to be hemostatic.   All instrument needle and Ray-Tec counts were correct x2. The patient tolerated the procedure well and was taken to the recovery room in stable condition. This is Cleda Mccreedy dictating an operative note on patient [name].

## 2012-06-23 NOTE — Interval H&P Note (Signed)
History and Physical Interval Note:  06/23/2012 10:42 AM  [name]  has presented today for surgery, with the diagnosis of endometrial cancer   The various methods of treatment have been discussed with the patient and family. After consideration of risks, benefits and other options for treatment, the patient has consented to  Procedure(s): ROBOTIC ASSISTED TOTAL HYSTERECTOMY WITH BILATERAL SALPINGO OOPHORECTOMY POSSIBLE LYMPH NODES, POSSIBLE LAPAROTOMY  (Bilateral) as a surgical intervention .  The patient's history has been reviewed, patient examined, no change in status, stable for surgery.  I have reviewed the patient's chart and labs.  Questions were answered to the patient's satisfaction.     Blair Lundeen A.

## 2012-06-24 ENCOUNTER — Encounter (HOSPITAL_COMMUNITY): Payer: Self-pay | Admitting: Gynecologic Oncology

## 2012-06-24 LAB — CBC
MCH: 30.5 pg (ref 26.0–34.0)
MCHC: 34.3 g/dL (ref 30.0–36.0)
MCV: 89.1 fL (ref 78.0–100.0)
Platelets: 327 10*3/uL (ref 150–400)
RDW: 12.5 % (ref 11.5–15.5)
WBC: 15 10*3/uL — ABNORMAL HIGH (ref 4.0–10.5)

## 2012-06-24 LAB — BASIC METABOLIC PANEL
CO2: 24 mEq/L (ref 19–32)
Calcium: 8.9 mg/dL (ref 8.4–10.5)
Creatinine, Ser: 0.75 mg/dL (ref 0.50–1.10)

## 2012-06-24 MED ORDER — OXYCODONE-ACETAMINOPHEN 5-325 MG PO TABS: 1.0000 | ORAL_TABLET | ORAL | Status: DC | PRN

## 2012-06-24 NOTE — Progress Notes (Signed)
Pt for d/c home today. IV d/c'd. Gauze dressing removed by Physician. Steristrips to incisions intact. Voiding w/o problem. Ambulates well. Tolerated diet. Sister at bedside to assist with d/c. Discharge instructions & Rx given with verbalized understanding. Pain well managed & tolerated.

## 2012-06-24 NOTE — Discharge Summary (Signed)
Physician Discharge Summary  Patient ID: [name] MRN: [id number] DOB/AGE: Jan 20, 1956 57 y.o.  Admit date: 06/23/2012 Discharge date: 06/24/2012  Admission Diagnoses: Endometrial carcinoma  Discharge Diagnoses:  Principal Problem:   Endometrial carcinoma  Discharged Condition: The patient is in good condition and stable for discharge.  Hospital Course:  On 06/23/2012, the patient underwent the following: Procedure(s):  ROBOTIC ASSISTED TOTAL HYSTERECTOMY WITH BILATERAL SALPINGO OOPHORECTOMY  LYMPH NODES, POSSIBLE LAPAROTOMY.  The postoperative course was uneventful.  She was discharged to home on postoperative day 1 tolerating a regular diet and passing flatus.  Consults: None  Significant Diagnostic Studies: None  Treatments: surgery: see above  Discharge Exam: Blood pressure 130/80, pulse 83, temperature 98.4 F (36.9 C), temperature source Oral, resp. rate 20, height 5\' 5"  (1.651 m), weight 215 lb (97.523 kg), SpO2 97.00%. General appearance: alert, cooperative and no distress Resp: clear to auscultation bilaterally Cardio: regular rate and rhythm, S1, S2 normal, no murmur, click, rub or gallop GI: soft, non-tender; bowel sounds normal; no masses,  no organomegaly Extremities: extremities normal, atraumatic, no cyanosis or edema Incision/Wound: Abdominal lap sites with steri strips clean, dry, and intact  Disposition: Home      Discharge Orders   Future Orders Complete By Expires     Call MD for:  difficulty breathing, headache or visual disturbances  As directed     Call MD for:  extreme fatigue  As directed     Call MD for:  hives  As directed     Call MD for:  persistant dizziness or light-headedness  As directed     Call MD for:  persistant nausea and vomiting  As directed     Call MD for:  redness, tenderness, or signs of infection (pain, swelling, redness, odor or green/yellow discharge around incision site)  As directed     Call MD for:  severe uncontrolled pain   As directed     Call MD for:  temperature >100.4  As directed     Diet - low sodium heart healthy  As directed     Driving Restrictions  As directed     Comments:      No driving for 1-2 weeks.  Do not take narcotics and drive.    Increase activity slowly  As directed     Lifting restrictions  As directed     Comments:      No lifting greater than 10 lbs.    Sexual Activity Restrictions  As directed     Comments:      No sexual activity, nothing in the vagina, for 8 weeks.        Medication List    TAKE these medications       ALPRAZolam 0.5 MG tablet  Commonly known as:  XANAX  Take 0.25 mg by mouth daily as needed for sleep or anxiety. 1/2 tab to 1 tab     AZOR 10-40 MG per tablet  Generic drug:  amLODipine-olmesartan  Take 1 tablet by mouth every morning.     famciclovir 500 MG tablet  Commonly known as:  FAMVIR  Take 500 mg by mouth daily as needed (outbreaks).     Lysine 500 MG Caps  Take by mouth daily.     metoprolol 100 MG tablet  Commonly known as:  LOPRESSOR  Take 100 mg by mouth once.     metoprolol succinate 100 MG 24 hr tablet  Commonly known as:  TOPROL-XL  Take 100 mg  by mouth every morning. Take with or immediately following a meal.     multivitamin with minerals tablet  Take 1 tablet by mouth daily.     oxyCODONE-acetaminophen 5-325 MG per tablet  Commonly known as:  PERCOCET/ROXICET  Take 1-2 tablets by mouth every 4 (four) hours as needed.     venlafaxine XR 75 MG 24 hr capsule  Commonly known as:  EFFEXOR-XR  Take 75 mg by mouth every morning.     vitamin E 400 UNIT capsule  Take 400 Units by mouth daily.       Follow-up Information   Please follow up. (GYN Oncology will call you with your final pathology results and to arrange a follow up appointment.)       Signed: Inioluwa Boulay DEAL 06/24/2012, 9:39 AM

## 2012-06-26 NOTE — Anesthesia Postprocedure Evaluation (Signed)
  Anesthesia Post-op Note  Patient: [name]  Procedure(s) Performed: Procedure(s) (LRB): ROBOTIC ASSISTED TOTAL HYSTERECTOMY WITH BILATERAL SALPINGO OOPHORECTOMY  LYMPH NODES, POSSIBLE LAPAROTOMY  (Bilateral)  Patient Location: PACU  Anesthesia Type: General  Level of Consciousness: awake and alert   Airway and Oxygen Therapy: Patient Spontanous Breathing  Post-op Pain: mild  Post-op Assessment: Post-op Vital signs reviewed, Patient's Cardiovascular Status Stable, Respiratory Function Stable, Patent Airway and No signs of Nausea or vomiting  Last Vitals:  Filed Vitals:   06/24/12 0947  BP: 131/71  Pulse: 106  Temp: 36.9 C  Resp: 20    Post-op Vital Signs: stable   Complications: No apparent anesthesia complications

## 2012-07-04 DIAGNOSIS — F32A Depression, unspecified: Secondary | ICD-10-CM | POA: Insufficient documentation

## 2012-07-21 ENCOUNTER — Ambulatory Visit: Payer: BC Managed Care – PPO | Admitting: Gynecologic Oncology

## 2012-07-21 DIAGNOSIS — T8189XS Other complications of procedures, not elsewhere classified, sequela: Secondary | ICD-10-CM

## 2012-07-21 NOTE — Patient Instructions (Signed)
Follow up as planned.  Please call for any questions or concerns.

## 2012-07-21 NOTE — Progress Notes (Signed)
S:  Patient reports moderate erythema with the left lateral lap site.  "It does not look like the others."  Adequate PO intake reported.  Bowels and bladder functioning without difficulty.  Pain minimal.    O:  Abdomen soft.  Left lateral lap site with localized erythema and scabbing.  No drainage noted.  Suture visualized and removed without difficulty with the use of suture removal kit.  No tenderness reported.  Triple antibiotic ointment applied and covered with a bandaid.  No signs of infection  A:  Erythema related to suture in the left lateral lap site.  P:  Advised to apply triple antibiotic ointment to the site daily.  Reportable signs and symptoms reviewed.  Follow up arranged.  Instructed to call for any needs or concerns.

## 2012-08-06 ENCOUNTER — Ambulatory Visit: Payer: Self-pay | Attending: Gynecologic Oncology | Admitting: Gynecologic Oncology

## 2012-08-06 ENCOUNTER — Encounter: Payer: Self-pay | Admitting: Gynecologic Oncology

## 2012-08-06 VITALS — BP 110/80 | HR 86 | Temp 98.6°F | Resp 20 | Ht 65.0 in | Wt 218.1 lb

## 2012-08-06 DIAGNOSIS — Z9079 Acquired absence of other genital organ(s): Secondary | ICD-10-CM | POA: Insufficient documentation

## 2012-08-06 DIAGNOSIS — C549 Malignant neoplasm of corpus uteri, unspecified: Secondary | ICD-10-CM | POA: Insufficient documentation

## 2012-08-06 DIAGNOSIS — Z90710 Acquired absence of both cervix and uterus: Secondary | ICD-10-CM | POA: Insufficient documentation

## 2012-08-06 DIAGNOSIS — I10 Essential (primary) hypertension: Secondary | ICD-10-CM | POA: Insufficient documentation

## 2012-08-06 DIAGNOSIS — C541 Malignant neoplasm of endometrium: Secondary | ICD-10-CM

## 2012-08-06 NOTE — Progress Notes (Signed)
Consult Note: Gyn-Onc  [name] 57 y.o. female  CC:  Chief Complaint  Patient presents with  . Endometrial cancer    Follow up    HPI: Patient is seen in consultation at the request of Dr. Debroah Loop.   Patient is a 57 year old gravida 2 para 2Z2 spontaneous vaginal deliveries. She completed menopause and had her last regular cycles in December 2012. Beginning in December 2013 she would notice an occasional slight blood tinge when she wipes. There is no specific blood flow. There was a close the time of her last menstrual period she felt that this was most likely just part of menopause. However, it was persistent. She did have a slight amount of cramping and she went to see Dr. Debroah Loop for evaluation. She states she did have an ultrasound performed that revealed a the uterus to be 8.3 x 4.2 x 4.7 cm. The endometrium had a width of 16 mm and was clearly abnormally thickened. The right ovary measured 2.8 x 0.8 x 1.7 cm and had a normal appearance. The left ovary measured 1.9 x 2.1 x 1.1 cm. There is no pelvic fluid identified. Of note, on the ultrasound it did comment on a loss of endometrial myometrial interface in the right anterior lateral portion of the uterus with a highly vascular echogenic area appearing to extend into the myometrium from the endometrium and the appearance is worrisome for endometrial carcinoma with myometrial extension.  Endometrial biopsy revealed endometrial adenocarcinoma. The pathology, and stated that there was endometrial adenocarcinoma arising in a background of complex atypical hyperplasia. It appeared to be grade 1  Interval History:   She is status post total robotic hysterectomy, bilateral salpingo-oophorectomy, and bilateral pelvic lymph node dissection on may 27th 2014. Operative findings included a globular uterus. She adhesive disease of the rectosigmoid colon to left pelvic sidewall. Frozen section revealed a grade 1 endometrial adenocarcinoma with minimal  myometrial invasion. Final pathology revealed:  Diagnosis 1. Uterus, ovaries and fallopian tubes, with cervix - ENDOMETRIOID ADENOCARCINOMA. - TUMOR INVADES MYOMETRIUM TO A DEPTH OF 0.8 CM. - CERVIX NOT INVOLVED. - UNREMARKABLE BILATERAL FALLOPIAN TUBES. - UNREMARKABLE BILATERAL OVARIES. 2. Lymph nodes, regional resection, right pelvic - FIVE BENIGN LYMPH NODES (0/5). 3. Lymph nodes, regional resection, left pelvic - TWO BENIGN LYMPH NODES (0/2).  She comes in today for her postoperative check.  Preoperatively she was having some abdominal discomfort in the right lower quadrant she is was due to "the function" of her ovaries. She states that it has recurred again postoperatively in the last 2 days has been a little bit worse. It has moved however previously was more towards the midline on the right side however is really more laterally now with overlying more her right hip. She states it feels somewhat swollen. Worse in the afternoons. She's had no vaginal bleeding she's had no change in her bowel or bladder habits. Her energy level is improving.  Current Meds:  Outpatient Encounter Prescriptions as of 56/10/2012  Medication Sig Dispense Refill  . ALPRAZolam (XANAX) 0.5 MG tablet Take 0.25 mg by mouth daily as needed for sleep or anxiety. 1/2 tab to 1 tab      . amLODipine-olmesartan (AZOR) 10-40 MG per tablet Take 1 tablet by mouth every morning.       . famciclovir (FAMVIR) 500 MG tablet Take 500 mg by mouth daily as needed (outbreaks).      Marland Kitchen Lysine 500 MG CAPS Take by mouth daily.       Marland Kitchen  metoprolol succinate (TOPROL-XL) 100 MG 24 hr tablet Take 100 mg by mouth every morning. Take with or immediately following a meal.      . Multiple Vitamins-Minerals (MULTIVITAMIN WITH MINERALS) tablet Take 1 tablet by mouth daily.      Marland Kitchen venlafaxine XR (EFFEXOR-XR) 75 MG 24 hr capsule Take 75 mg by mouth every morning.      . vitamin E 400 UNIT capsule Take 400 Units by mouth daily.      . metoprolol  (LOPRESSOR) 100 MG tablet Take 100 mg by mouth once.      Marland Kitchen oxyCODONE-acetaminophen (PERCOCET/ROXICET) 5-325 MG per tablet Take 1-2 tablets by mouth every 4 (four) hours as needed.  30 tablet  0   No facility-administered encounter medications on file as of 08/06/2012.    Allergy:  Allergies  Allergen Reactions  . Cola (Syrup) Anaphylaxis  . Sulfa Antibiotics Hives and Rash    Social Hx:   History   Social History  . Marital Status: Widowed    Spouse Name: N/A    Number of Children: N/A  . Years of Education: N/A   Occupational History  . Not on file.   Social History Main Topics  . Smoking status: Never Smoker   . Smokeless tobacco: Never Used  . Alcohol Use: 0.6 oz/week    1 Glasses of wine per week     Comment: occas  . Drug Use: No  . Sexually Active: No   Other Topics Concern  . Not on file   Social History Narrative  . No narrative on file    Past Surgical Hx:  Past Surgical History  Procedure Laterality Date  . Appendectomy    . Btl    . Hiatal hernia repair  12/1999  . Tubal ligation    . Robotic assisted total hysterectomy with bilateral salpingo oopherectomy Bilateral 06/23/2012    Procedure: ROBOTIC ASSISTED TOTAL HYSTERECTOMY WITH BILATERAL SALPINGO OOPHORECTOMY  LYMPH NODES, POSSIBLE LAPAROTOMY ;  Surgeon: Rejeana Brock A. Duard Brady, MD;  Location: [facility];  Service: Gynecology;  Laterality: Bilateral;    Past Medical Hx:  Past Medical History  Diagnosis Date  . Hypertension   . Depression   . Anxiety   . Cancer     endometrial  . Kidney, benign tumor checked every 5 years    right kidney spot  . Complication of anesthesia     slow to awaken in past    Oncology Hx:    Endometrial carcinoma   05/18/2012 Initial Diagnosis Endometrial carcinoma   06/10/2012 Surgery total robotic hysterectomy, BSO and pelvic lymph node dissection, Stage I A grade 1 endometrial cancer./ < 50% invasion, no LVSI    Family Hx:  Family History  Problem Relation Age of  Onset  . Hypertension Mother   . Diabetes Father   . Cancer Father   . Hypertension Father   . Hypertension Sister   . Cancer Brother   . Hypertension Brother   . Cancer Paternal Aunt   . Cancer Maternal Grandmother   . Cancer Paternal Grandmother     Vitals:  Blood pressure 110/80, pulse 86, temperature 98.6 F (37 C), temperature source Oral, resp. rate 20, height 5\' 5"  (1.651 m), weight 218 lb 1.6 oz (98.93 kg), last menstrual period 05/04/2012.  Physical Exam: Well-nourished vulva female in no acute distress.  Abdomen: Well-healed surgical incisions. Abdomen is soft and tender only to deep palpation in the right mid lower abdomen. There is no rebound or guarding  there is no palpable masses. There is no abdominal wall changes is no appreciable hernias.  Extremities: No edema.  Pelvic: Normal external female genitalia. Vagina slightly atrophic. The vaginal cuff is visualized are well-healed. Bimanual examination was no fluctuance tenderness or masses.  Assessment/Plan: 57 year old with a grade 1 stage IA endometrioid adenocarcinoma of the uterus status post definitive surgery. She's doing well postoperatively. She return to see me in 6 months and we will begin alternating visits with Dr. Debroah Loop. We explain the discomfort that she's having.  Cleda Mccreedy A., MD 08/06/2012, 2:28 PM

## 2012-08-06 NOTE — Patient Instructions (Addendum)
Hysterectomy °Care After °Refer to this sheet in the next few weeks. These instructions provide you with information on caring for yourself after your procedure. Your caregiver may also give you more specific instructions. Your treatment has been planned according to current medical practices, but problems sometimes occur. Call your caregiver if you have any problems or questions after your procedure. °HOME CARE INSTRUCTIONS  °Healing will take time. You may have discomfort, tenderness, swelling, and bruising at the surgical site for about 2 weeks. This is normal and will get better as time goes on. °· Only take over-the-counter or prescription medicines for pain, discomfort, or fever as directed by your caregiver. °· Do not take aspirin. It can cause bleeding. °· Do not drive when taking pain medicine. °· Follow your caregiver's advice regarding exercise, lifting, driving, and general activities. °· Resume your usual diet as directed and allowed. °· Get plenty of rest and sleep. °· Do not douche, use tampons, or have sexual intercourse for at least 6 weeks or until your caregiver gives you permission. °· Change your bandages (dressings) as directed by your caregiver. °· Monitor your temperature. °· Take showers instead of baths for 2 to 3 weeks. °· Do not drink alcohol until your caregiver gives you permission. °· If you are constipated, you may take a mild laxative with your caregiver's permission. Bran foods may help with constipation problems. Drinking enough fluids to keep your urine clear or pale yellow may help as well. °· Try to have someone home with you for 1 or 2 weeks to help around the house. °· Keep all of your follow-up appointments as directed by your caregiver. °SEEK MEDICAL CARE IF:  °· You have swelling, redness, or increasing pain in the surgical cut (incision) area. °· You have pus coming from the incision. °· You notice a bad smell coming from the incision or dressing. °· You have swelling,  redness, or pain around the intravenous (IV) site. °· Your incision breaks open. °· You feel dizzy or lightheaded. °· You have pain or bleeding when you urinate. °· You have persistent diarrhea. °· You have persistent nausea and vomiting. °· You have abnormal vaginal discharge. °· You have a rash. °· You have any type of abnormal reaction or develop an allergy to your medicine. °· Your pain is not controlled with your prescribed medicine. °SEEK IMMEDIATE MEDICAL CARE IF:  °· You have a fever. °· You have severe abdominal pain. °· You have chest pain. °· You have shortness of breath. °· You faint. °· You have pain, swelling, or redness of your leg. °· You have heavy vaginal bleeding with blood clots. °MAKE SURE YOU: °· Understand these instructions. °· Will watch your condition. °· Will get help right away if you are not doing well or get worse. °Document Released: 08/03/2004 Document Revised: 04/08/2011 Document Reviewed: 08/31/2010 °[employer]® Patient Information ©2014 [employer]. ° °

## 2012-08-18 ENCOUNTER — Other Ambulatory Visit: Payer: Self-pay | Admitting: Family Medicine

## 2012-08-18 DIAGNOSIS — Z1231 Encounter for screening mammogram for malignant neoplasm of breast: Secondary | ICD-10-CM

## 2012-09-10 ENCOUNTER — Ambulatory Visit
Admission: RE | Admit: 2012-09-10 | Discharge: 2012-09-10 | Disposition: A | Discharge status: Home / Self Care | Payer: BC Managed Care – PPO | Source: Ambulatory Visit | Attending: Family Medicine | Admitting: Family Medicine

## 2012-09-10 DIAGNOSIS — Z1231 Encounter for screening mammogram for malignant neoplasm of breast: Secondary | ICD-10-CM

## 2012-09-15 ENCOUNTER — Other Ambulatory Visit: Payer: Self-pay | Admitting: Family Medicine

## 2012-09-15 DIAGNOSIS — R928 Other abnormal and inconclusive findings on diagnostic imaging of breast: Secondary | ICD-10-CM

## 2012-12-03 ENCOUNTER — Other Ambulatory Visit: Payer: Self-pay

## 2013-02-02 ENCOUNTER — Ambulatory Visit
Admission: RE | Admit: 2013-02-02 | Discharge: 2013-02-02 | Disposition: A | Discharge status: Home / Self Care | Payer: Commercial Managed Care - PPO | Source: Ambulatory Visit | Attending: Family Medicine | Admitting: Family Medicine

## 2013-02-02 DIAGNOSIS — R928 Other abnormal and inconclusive findings on diagnostic imaging of breast: Secondary | ICD-10-CM

## 2013-08-10 ENCOUNTER — Other Ambulatory Visit: Payer: Self-pay | Admitting: Orthopedic Surgery

## 2013-08-10 DIAGNOSIS — R609 Edema, unspecified: Secondary | ICD-10-CM

## 2013-08-10 DIAGNOSIS — M25562 Pain in left knee: Secondary | ICD-10-CM

## 2013-08-16 ENCOUNTER — Ambulatory Visit
Admission: RE | Admit: 2013-08-16 | Discharge: 2013-08-16 | Disposition: A | Discharge status: Home / Self Care | Payer: Commercial Managed Care - PPO | Source: Ambulatory Visit | Attending: Orthopedic Surgery | Admitting: Orthopedic Surgery

## 2013-08-16 DIAGNOSIS — M25562 Pain in left knee: Secondary | ICD-10-CM

## 2013-08-16 DIAGNOSIS — R609 Edema, unspecified: Secondary | ICD-10-CM

## 2013-11-29 ENCOUNTER — Encounter: Payer: Self-pay | Admitting: Gynecologic Oncology

## 2015-11-08 ENCOUNTER — Other Ambulatory Visit: Payer: Self-pay | Admitting: Family Medicine

## 2015-11-08 DIAGNOSIS — Z1231 Encounter for screening mammogram for malignant neoplasm of breast: Secondary | ICD-10-CM

## 2015-11-23 ENCOUNTER — Ambulatory Visit
Admission: RE | Admit: 2015-11-23 | Discharge: 2015-11-23 | Disposition: A | Discharge status: Home / Self Care | Payer: Managed Care, Other (non HMO) | Source: Ambulatory Visit | Attending: Family Medicine | Admitting: Family Medicine

## 2015-11-23 DIAGNOSIS — Z1231 Encounter for screening mammogram for malignant neoplasm of breast: Secondary | ICD-10-CM

## 2015-11-27 ENCOUNTER — Other Ambulatory Visit: Payer: Self-pay | Admitting: Family Medicine

## 2015-11-27 DIAGNOSIS — R928 Other abnormal and inconclusive findings on diagnostic imaging of breast: Secondary | ICD-10-CM

## 2015-12-07 ENCOUNTER — Other Ambulatory Visit: Payer: Managed Care, Other (non HMO)

## 2015-12-15 ENCOUNTER — Ambulatory Visit
Admission: RE | Admit: 2015-12-15 | Discharge: 2015-12-15 | Disposition: A | Discharge status: Home / Self Care | Payer: Managed Care, Other (non HMO) | Source: Ambulatory Visit | Attending: Family Medicine | Admitting: Family Medicine

## 2015-12-15 DIAGNOSIS — R928 Other abnormal and inconclusive findings on diagnostic imaging of breast: Secondary | ICD-10-CM

## 2016-09-13 DIAGNOSIS — D1771 Benign lipomatous neoplasm of kidney: Secondary | ICD-10-CM | POA: Insufficient documentation

## 2017-08-04 ENCOUNTER — Ambulatory Visit: Payer: Managed Care, Other (non HMO) | Admitting: Allergy and Immunology

## 2017-08-04 ENCOUNTER — Encounter: Payer: Self-pay | Admitting: Allergy and Immunology

## 2017-08-04 VITALS — BP 120/80 | HR 86 | Temp 98.5°F | Resp 16 | Ht 63.75 in | Wt 219.4 lb

## 2017-08-04 DIAGNOSIS — T7800XA Anaphylactic reaction due to unspecified food, initial encounter: Secondary | ICD-10-CM | POA: Insufficient documentation

## 2017-08-04 DIAGNOSIS — T7800XD Anaphylactic reaction due to unspecified food, subsequent encounter: Secondary | ICD-10-CM | POA: Diagnosis not present

## 2017-08-04 DIAGNOSIS — L500 Allergic urticaria: Secondary | ICD-10-CM | POA: Insufficient documentation

## 2017-08-04 DIAGNOSIS — Z91018 Allergy to other foods: Secondary | ICD-10-CM | POA: Insufficient documentation

## 2017-08-04 MED ORDER — EPINEPHRINE 0.3 MG/0.3ML IJ SOAJ: 0.3000 mg | Freq: Once | INTRAMUSCULAR | 2 refills | Status: AC

## 2017-08-04 NOTE — Progress Notes (Signed)
New Patient Note  RE: [name] MRN: [id number] DOB: Aug 25, 1955 Date of Office Visit: 08/04/2017  Referring provider: Donato Schultz, * Primary care provider: Zola Button, Grayling Congress, DO  Chief Complaint: Allergic Reaction and Food Intolerance   History of present illness: [name] is a 62 y.o. female seen today in consultation requested by Seabron Spates, DO.  She complains of nausea, vomiting, diarrhea, and some abdominal discomfort which she associates with the consumption of egg.  These GI symptoms typically occur within 30 minutes of consuming egg.  She states that when she initially recognize this problem, approximately 6 months ago, she experienced "almost immediate" nausea, vomiting, and diarrhea.  She is able to consume baked goods containing eggs without symptoms, however has had problems with foods such as mayonnaise and custard.  In addition, she recently consumed a sausage biscuit which had been cooked on the same griddle as eggs and she experienced mild diarrhea.  She does not experience concomitant urticaria, angioedema, or cardiopulmonary symptoms.  She notes that since she was 62 years old she has experienced adverse symptoms, including generalized urticaria, angioedema, dyspnea, and the sensation of throat tightness, with the consumption of beverages containing cola.  She avoids cola-containing beverages, however does not have an epinephrine autoinjector currently.  Assessment and plan: History of food allergy Gastrointestinal symptoms, uncertain etiology. Skin tests to select food allergens were negative today. The negative predictive value of food allergen skin testing is excellent (approximately 95%). While this does not appear to be an IgE mediated issue, skin testing does not rule out food intolerances or cell-mediated enteropathies which may lend to GI symptoms. These etiologies are suggested when elimination of the responsible food leads to symptom resolution  and re-introduction of the food is followed by the return of symptoms.   To be thorough, the following labs will be checked: Serum specific IgE against egg white, egg yolk, egg components, and alpha gal panel.  The patient has been encouraged to keep a careful symptom/food journal and eliminate any food suspected of correlating with symptoms. Should symptoms concerning for anaphylaxis arise, epinephrine is to be administered and 911 is to be called immediately.  If GI symptoms persist or progress, gastroenterologist evaluation may be warranted.  Allergy with anaphylaxis due to food The patient's history suggests cola allergy.    A laboratory order form has been provided for serum specific IgE against cola nut.  For now, continue meticulous avoidance of cola containing products as discussed.  A prescription has been provided for epinephrine auto-injector 2 pack along with instructions for proper administration.  A food allergy action plan has been provided and discussed.  Medic Alert identification is recommended.   Meds ordered this encounter  Medications  . EPINEPHrine (AUVI-Q) 0.3 mg/0.3 mL IJ SOAJ injection    Sig: Inject 0.3 mLs (0.3 mg total) into the muscle once for 1 dose. As directed for life-threatening allergic reactions    Dispense:  4 Device    Refill:  2    Please call [phone] for delivery.    Diagnostics: Food allergen skin testing: Negative despite a positive histamine control.    Physical examination: Blood pressure 120/80, pulse 86, temperature 98.5 F (36.9 C), temperature source Oral, resp. rate 16, height 5' 3.75" (1.619 m), weight 219 lb 6.4 oz (99.5 kg), last menstrual period 05/08/2012, SpO2 95 %.  General: Alert, interactive, in no acute distress. Neck: Supple without lymphadenopathy. Lungs: Clear to auscultation without wheezing, rhonchi or  rales. CV: Normal S1, S2 without murmurs. Abdomen: Nondistended, nontender. Skin: Warm and dry, without  lesions or rashes. Extremities:  No clubbing, cyanosis or edema. Neuro:   Grossly intact.  Review of systems:  Review of systems negative except as noted in HPI / PMHx or noted below: Review of Systems  Constitutional: Negative.   HENT: Negative.   Eyes: Negative.   Respiratory: Negative.   Cardiovascular: Negative.   Gastrointestinal: Negative.   Genitourinary: Negative.   Musculoskeletal: Negative.   Skin: Negative.   Neurological: Negative.   Endo/Heme/Allergies: Negative.   Psychiatric/Behavioral: Negative.     Past medical history:  Past Medical History:  Diagnosis Date  . Anxiety   . Cancer (HCC)    endometrial  . Complication of anesthesia    slow to awaken in past  . Depression   . Hypertension   . Kidney, benign tumor checked every 5 years   right kidney spot  . Urticaria     Past surgical history:  Past Surgical History:  Procedure Laterality Date  . APPENDECTOMY    . BTL    . HIATAL HERNIA REPAIR  12/1999  . ROBOTIC ASSISTED TOTAL HYSTERECTOMY WITH BILATERAL SALPINGO OOPHERECTOMY Bilateral 06/23/2012   Procedure: ROBOTIC ASSISTED TOTAL HYSTERECTOMY WITH BILATERAL SALPINGO OOPHORECTOMY  LYMPH NODES, POSSIBLE LAPAROTOMY ;  Surgeon: Rejeana Brock A. Duard Brady, MD;  Location: [facility];  Service: Gynecology;  Laterality: Bilateral;  . TUBAL LIGATION      Family history: Family History  Problem Relation Age of Onset  . Hypertension Mother   . Diabetes Father   . Cancer Father   . Hypertension Father   . Hypertension Sister   . Cancer Brother   . Hypertension Brother   . Cancer Paternal Aunt   . Cancer Maternal Grandmother   . Cancer Paternal Grandmother     Social history: Social History   Socioeconomic History  . Marital status: Widowed    Spouse name: Not on file  . Number of children: Not on file  . Years of education: Not on file  . Highest education level: Not on file  Occupational History  . Not on file  Social Needs  . Financial resource strain:  Not on file  . Food insecurity:    Worry: Not on file    Inability: Not on file  . Transportation needs:    Medical: Not on file    Non-medical: Not on file  Tobacco Use  . Smoking status: Never Smoker  . Smokeless tobacco: Never Used  Substance and Sexual Activity  . Alcohol use: Yes    Alcohol/week: 0.6 oz    Types: 1 Glasses of wine per week    Comment: occas  . Drug use: No  . Sexual activity: Never  Lifestyle  . Physical activity:    Days per week: Not on file    Minutes per session: Not on file  . Stress: Not on file  Relationships  . Social connections:    Talks on phone: Not on file    Gets together: Not on file    Attends religious service: Not on file    Active member of club or organization: Not on file    Attends meetings of clubs or organizations: Not on file    Relationship status: Not on file  . Intimate partner violence:    Fear of current or ex partner: Not on file    Emotionally abused: Not on file    Physically abused: Not on file  Forced sexual activity: Not on file  Other Topics Concern  . Not on file  Social History Narrative  . Not on file   Environmental History: The patient lives in a [address] year old townhouse with carpeting the bedroom and central air/heat.  There is a dog in the home which has access to her bedroom.  There is no known mold/water damage in the home.  She is a non-smoker.  Allergies as of 08/04/2017      Reactions   Cola (syrup) Anaphylaxis   Sulfa Antibiotics Hives, Rash      Medication List        Accurate as of 08/04/17 12:50 PM. Always use your most recent med list.          ALPRAZolam 0.5 MG tablet Commonly known as:  XANAX Take 0.25 mg by mouth daily as needed for sleep or anxiety. 1/2 tab to 1 tab   AZOR 10-40 MG tablet Generic drug:  amLODipine-olmesartan Take 1 tablet by mouth every morning.   EPINEPHrine 0.3 mg/0.3 mL Soaj injection Commonly known as:  AUVI-Q Inject 0.3 mLs (0.3 mg total) into the muscle  once for 1 dose. As directed for life-threatening allergic reactions   escitalopram 20 MG tablet Commonly known as:  LEXAPRO Take 20 mg by mouth daily.   famciclovir 500 MG tablet Commonly known as:  FAMVIR Take 500 mg by mouth daily as needed (outbreaks).   Lysine 500 MG Caps Take by mouth daily.   metFORMIN 500 MG tablet Commonly known as:  GLUCOPHAGE Take 500 mg by mouth daily.   metoprolol succinate 100 MG 24 hr tablet Commonly known as:  TOPROL-XL Take 100 mg by mouth every morning. Take with or immediately following a meal.   metoprolol tartrate 100 MG tablet Commonly known as:  LOPRESSOR Take 100 mg by mouth once.   multivitamin with minerals tablet Take 1 tablet by mouth daily.   oxyCODONE-acetaminophen 5-325 MG tablet Commonly known as:  PERCOCET/ROXICET Take 1-2 tablets by mouth every 4 (four) hours as needed.   venlafaxine XR 75 MG 24 hr capsule Commonly known as:  EFFEXOR-XR Take 75 mg by mouth every morning.   vitamin E 400 UNIT capsule Take 400 Units by mouth daily.       Known medication allergies: Allergies  Allergen Reactions  . Cola (Syrup) Anaphylaxis  . Sulfa Antibiotics Hives and Rash    I appreciate the opportunity to take part in Crytal's care. Please do not hesitate to contact me with questions.  Sincerely,   R. Jorene Guest, MD

## 2017-08-04 NOTE — Patient Instructions (Addendum)
History of food allergy Gastrointestinal symptoms, uncertain etiology. Skin tests to select food allergens were negative today. The negative predictive value of food allergen skin testing is excellent (approximately 95%). While this does not appear to be an IgE mediated issue, skin testing does not rule out food intolerances or cell-mediated enteropathies which may lend to GI symptoms. These etiologies are suggested when elimination of the responsible food leads to symptom resolution and re-introduction of the food is followed by the return of symptoms.   To be thorough, the following labs will be checked: Serum specific IgE against egg white, egg yolk, egg components, and alpha gal panel.  The patient has been encouraged to keep a careful symptom/food journal and eliminate any food suspected of correlating with symptoms. Should symptoms concerning for anaphylaxis arise, epinephrine is to be administered and 911 is to be called immediately.  If GI symptoms persist or progress, gastroenterologist evaluation may be warranted.  Allergy with anaphylaxis due to food The patient's history suggests cola allergy.    A laboratory order form has been provided for serum specific IgE against cola nut.  For now, continue meticulous avoidance of cola containing products as discussed.  A prescription has been provided for epinephrine auto-injector 2 pack along with instructions for proper administration.  A food allergy action plan has been provided and discussed.  Medic Alert identification is recommended.   When lab results have returned the patient will be called with further recommendations and follow up instructions.

## 2017-08-04 NOTE — Assessment & Plan Note (Addendum)
The patient's history suggests cola allergy.    A laboratory order form has been provided for serum specific IgE against cola nut.  For now, continue meticulous avoidance of cola containing products as discussed.  A prescription has been provided for epinephrine auto-injector 2 pack along with instructions for proper administration.  A food allergy action plan has been provided and discussed.  Medic Alert identification is recommended.

## 2017-08-04 NOTE — Assessment & Plan Note (Signed)
Gastrointestinal symptoms, uncertain etiology. Skin tests to select food allergens were negative today. The negative predictive value of food allergen skin testing is excellent (approximately 95%). While this does not appear to be an IgE mediated issue, skin testing does not rule out food intolerances or cell-mediated enteropathies which may lend to GI symptoms. These etiologies are suggested when elimination of the responsible food leads to symptom resolution and re-introduction of the food is followed by the return of symptoms.   To be thorough, the following labs will be checked: Serum specific IgE against egg white, egg yolk, egg components, and alpha gal panel.  The patient has been encouraged to keep a careful symptom/food journal and eliminate any food suspected of correlating with symptoms. Should symptoms concerning for anaphylaxis arise, epinephrine is to be administered and 911 is to be called immediately.  If GI symptoms persist or progress, gastroenterologist evaluation may be warranted.

## 2017-08-07 LAB — ALPHA-GAL PANEL
Alpha Gal IgE*: <0.1 kU/L (ref <0.10)
Beef (Bos spp) IgE: <0.1 kU/L (ref <0.35)
Class Interpretation: 0
Class Interpretation: 0
Class Interpretation: 0
Lamb/Mutton (Ovis spp) IgE: <0.1 kU/L (ref <0.35)
Pork (Sus spp) IgE: <0.1 kU/L (ref <0.35)

## 2017-08-07 LAB — EGG COMPONENT PANEL
F232-IgE Ovalbumin: 0.15 kU/L — AB
F233-IgE Ovomucoid: 0.36 kU/L — AB

## 2017-08-07 LAB — ALLERGEN EGG YOLK F75: IgE Egg (Yolk): 0.2 kU/L — AB

## 2017-08-07 LAB — ALLERGEN EGG WHITE F1: Egg White IgE: 0.51 kU/L — AB

## 2017-08-08 LAB — COLA NUT, IGE
COLA NUT, IGE: <0.35 kU/L (ref <0.35)
Class Interpretation: 0

## 2017-08-12 ENCOUNTER — Telehealth: Payer: Self-pay | Admitting: *Deleted

## 2017-08-12 NOTE — Telephone Encounter (Signed)
I called and left a voicemail for the patient after receiving a fax from [facility] that they were unable to communicate with her. I asked that she contact them or give [place] a call if she needs assistance to ensure that she gets her Epipen.

## 2017-08-19 ENCOUNTER — Other Ambulatory Visit: Payer: Self-pay

## 2017-10-07 ENCOUNTER — Other Ambulatory Visit: Payer: Self-pay | Admitting: Family Medicine

## 2017-10-07 DIAGNOSIS — Z1231 Encounter for screening mammogram for malignant neoplasm of breast: Secondary | ICD-10-CM

## 2017-11-07 ENCOUNTER — Emergency Department (HOSPITAL_BASED_OUTPATIENT_CLINIC_OR_DEPARTMENT_OTHER)
Admission: EM | Admit: 2017-11-07 | Discharge: 2017-11-07 | Disposition: A | Discharge status: Home / Self Care | Payer: Managed Care, Other (non HMO) | Attending: Emergency Medicine | Admitting: Emergency Medicine

## 2017-11-07 ENCOUNTER — Emergency Department (HOSPITAL_BASED_OUTPATIENT_CLINIC_OR_DEPARTMENT_OTHER): Payer: Managed Care, Other (non HMO)

## 2017-11-07 ENCOUNTER — Other Ambulatory Visit: Payer: Self-pay

## 2017-11-07 ENCOUNTER — Encounter (HOSPITAL_BASED_OUTPATIENT_CLINIC_OR_DEPARTMENT_OTHER): Payer: Self-pay | Admitting: *Deleted

## 2017-11-07 DIAGNOSIS — Z8542 Personal history of malignant neoplasm of other parts of uterus: Secondary | ICD-10-CM | POA: Insufficient documentation

## 2017-11-07 DIAGNOSIS — R55 Syncope and collapse: Secondary | ICD-10-CM | POA: Diagnosis not present

## 2017-11-07 DIAGNOSIS — Z7984 Long term (current) use of oral hypoglycemic drugs: Secondary | ICD-10-CM | POA: Diagnosis not present

## 2017-11-07 DIAGNOSIS — I10 Essential (primary) hypertension: Secondary | ICD-10-CM | POA: Diagnosis not present

## 2017-11-07 DIAGNOSIS — Z79899 Other long term (current) drug therapy: Secondary | ICD-10-CM | POA: Insufficient documentation

## 2017-11-07 DIAGNOSIS — R739 Hyperglycemia, unspecified: Secondary | ICD-10-CM

## 2017-11-07 DIAGNOSIS — E1165 Type 2 diabetes mellitus with hyperglycemia: Secondary | ICD-10-CM | POA: Insufficient documentation

## 2017-11-07 DIAGNOSIS — D3001 Benign neoplasm of right kidney: Secondary | ICD-10-CM | POA: Insufficient documentation

## 2017-11-07 DIAGNOSIS — R531 Weakness: Secondary | ICD-10-CM | POA: Diagnosis present

## 2017-11-07 LAB — RAPID URINE DRUG SCREEN, HOSP PERFORMED
Amphetamines: NOT DETECTED
BENZODIAZEPINES: NOT DETECTED
Barbiturates: NOT DETECTED
Cocaine: NOT DETECTED
Opiates: NOT DETECTED
Tetrahydrocannabinol: NOT DETECTED

## 2017-11-07 LAB — COMPREHENSIVE METABOLIC PANEL
ALK PHOS: 78 U/L (ref 38–126)
ALT: 17 U/L (ref 0–44)
AST: 14 U/L — ABNORMAL LOW (ref 15–41)
Albumin: 3.7 g/dL (ref 3.5–5.0)
Anion gap: 12 (ref 5–15)
BUN: 12 mg/dL (ref 8–23)
CALCIUM: 9.2 mg/dL (ref 8.9–10.3)
CO2: 25 mmol/L (ref 22–32)
CREATININE: 0.84 mg/dL (ref 0.44–1.00)
Chloride: 99 mmol/L (ref 98–111)
GFR calc non Af Amer: >60 mL/min (ref >60)
Glucose, Bld: 268 mg/dL — ABNORMAL HIGH (ref 70–99)
Potassium: 3.9 mmol/L (ref 3.5–5.1)
SODIUM: 136 mmol/L (ref 135–145)
Total Bilirubin: 0.5 mg/dL (ref 0.3–1.2)
Total Protein: 7.6 g/dL (ref 6.5–8.1)

## 2017-11-07 LAB — URINALYSIS, ROUTINE W REFLEX MICROSCOPIC
Bilirubin Urine: NEGATIVE
HGB URINE DIPSTICK: NEGATIVE
KETONES UR: 15 mg/dL — AB
Leukocytes, UA: NEGATIVE
Nitrite: NEGATIVE
PH: 5.5 (ref 5.0–8.0)
Protein, ur: NEGATIVE mg/dL
Specific Gravity, Urine: 1.025 (ref 1.005–1.030)

## 2017-11-07 LAB — URINALYSIS, MICROSCOPIC (REFLEX)
RBC / HPF: NONE SEEN RBC/hpf (ref 0–5)
WBC, UA: NONE SEEN WBC/hpf (ref 0–5)

## 2017-11-07 LAB — CBC
HCT: 46.9 % — ABNORMAL HIGH (ref 36.0–46.0)
Hemoglobin: 16 g/dL — ABNORMAL HIGH (ref 12.0–15.0)
MCH: 30.5 pg (ref 26.0–34.0)
MCHC: 34.1 g/dL (ref 30.0–36.0)
MCV: 89.5 fL (ref 80.0–100.0)
NRBC: 0 % (ref 0.0–0.2)
PLATELETS: 315 10*3/uL (ref 150–400)
RBC: 5.24 MIL/uL — ABNORMAL HIGH (ref 3.87–5.11)
RDW: 12.3 % (ref 11.5–15.5)
WBC: 8.2 10*3/uL (ref 4.0–10.5)

## 2017-11-07 LAB — TROPONIN I: Troponin I: <0.03 ng/mL (ref <0.03)

## 2017-11-07 NOTE — ED Notes (Signed)
Pt alert, calm. Steady gait at present. Denies pain, sob. Skin warm and dry. Speech clear.

## 2017-11-07 NOTE — Discharge Instructions (Addendum)
Please call and follow up with your doctor for further evaluation of your condition.  Return if your condition worsen or if you have other concerns.

## 2017-11-07 NOTE — ED Notes (Signed)
ED Provider at bedside. 

## 2017-11-07 NOTE — ED Notes (Signed)
Patient transported to CT 

## 2017-11-07 NOTE — ED Provider Notes (Signed)
[facility] EMERGENCY DEPARTMENT Provider Note   CSN: [id number] Arrival date & time: 11/07/17  1331     History   Chief Complaint Chief Complaint  Patient presents with  . Weakness    HPI [name] is a 62 y.o. female.  The history is provided by the patient. No language interpreter was used.  Weakness      62 year old female with history of endometrial cancer, depression, hypertension, anxiety presenting for evaluation of unsteady gait.  Patient report at 4 AM this morning she got up to use the bathroom, upon getting out of bed, she felt "drunk" and was having trouble making to the bathroom to urinate.  She did not describe room spinning sensation, or having trouble leaning on one side more than the other.  However, the sensation was concerning enough that she took half of a Xanax and went back to sleep.  At 7 AM she woke up and his symptom has completely abated.  She went to work and had a full shift without any complication.  She did call and talk to her doctor in regard to her symptoms and was recommended to come to the ER for further evaluation.  She mentioned she was fine the night before.  She denies any associated fever, headache, vision changes, confusion, facial numbness, chest pain, trouble breathing, focal numbness or weakness, dysuria.  She admits that she has been going through a lot of stress throughout the week at work.  She also report having a mechanical fall 4 days prior when she lost control while walking her dog and fell forward but denies hitting her head or loss of consciousness.  She was recently diagnosed with diabetes and is currently on metformin.  She denies alcohol tobacco abuse.  She denies any prior history of stroke.  Past Medical History:  Diagnosis Date  . Anxiety   . Cancer ([place])    endometrial  . Complication of anesthesia    slow to awaken in past  . Depression   . Hypertension   . Kidney, benign tumor checked every 5 years   right kidney spot  . Urticaria     Patient Active Problem List   Diagnosis Date Noted  . History of food allergy 08/04/2017  . Allergy with anaphylaxis due to food 08/04/2017  . Allergic urticaria 08/04/2017  . Endometrial carcinoma ([place]) 05/18/2012  . Postmenopausal bleeding 04/13/2012  . ANXIETY STATE NEC 07/31/2006  . 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 HYPERTENSION 06/16/2006  . MITRAL VALVE PROLAPSE, HX OF 06/16/2006  . HIATAL HERNIA, HX OF 06/16/2006  . INSOMNIA, HX OF 06/16/2006    Past Surgical History:  Procedure Laterality Date  . APPENDECTOMY    . BTL    . HIATAL HERNIA REPAIR  12/1999  . ROBOTIC ASSISTED TOTAL HYSTERECTOMY WITH BILATERAL SALPINGO OOPHERECTOMY Bilateral 06/23/2012   Procedure: ROBOTIC ASSISTED TOTAL HYSTERECTOMY WITH BILATERAL SALPINGO OOPHORECTOMY  LYMPH NODES, POSSIBLE LAPAROTOMY ;  Surgeon: Imagene Gurney A. Alycia Rossetti, MD;  Location: [facility];  Service: Gynecology;  Laterality: Bilateral;  . TUBAL LIGATION       OB History    Gravida  2   Para  2   Term  2   Preterm      AB      Living  2     SAB      TAB      Ectopic      Multiple      Live Births  Home Medications    Prior to Admission medications   Medication Sig Start Date End Date Taking? Authorizing Provider  ALPRAZolam Duanne Moron) 0.5 MG tablet Take 0.25 mg by mouth daily as needed for sleep or anxiety. 1/2 tab to 1 tab    [provider]  amLODipine-olmesartan (AZOR) 10-40 MG per tablet Take 1 tablet by mouth every morning.     [provider]  escitalopram (LEXAPRO) 20 MG tablet Take 20 mg by mouth daily.    [provider]  famciclovir (FAMVIR) 500 MG tablet Take 500 mg by mouth daily as needed (outbreaks).    [provider]  Lysine 500 MG CAPS Take by mouth daily.     [provider]  metFORMIN (GLUCOPHAGE) 500 MG tablet Take 500 mg by mouth daily.    [provider]  metoprolol (LOPRESSOR) 100 MG tablet Take 100  mg by mouth once.    [provider]  metoprolol succinate (TOPROL-XL) 100 MG 24 hr tablet Take 100 mg by mouth every morning. Take with or immediately following a meal.    [provider]  Multiple Vitamins-Minerals (MULTIVITAMIN WITH MINERALS) tablet Take 1 tablet by mouth daily.    [provider]  oxyCODONE-acetaminophen (PERCOCET/ROXICET) 5-325 MG per tablet Take 1-2 tablets by mouth every 4 (four) hours as needed. Patient not taking: Reported on 08/04/2017 06/24/12   Joylene John D, NP  venlafaxine XR (EFFEXOR-XR) 75 MG 24 hr capsule Take 75 mg by mouth every morning.    [provider]  vitamin E 400 UNIT capsule Take 400 Units by mouth daily.    [provider]    Family History Family History  Problem Relation Age of Onset  . Hypertension Mother   . Diabetes Father   . Cancer Father   . Hypertension Father   . Hypertension Sister   . Cancer Brother   . Hypertension Brother   . Cancer Paternal Aunt   . Cancer Maternal Grandmother   . Cancer Paternal Grandmother     Social History Social History   Tobacco Use  . Smoking status: Never Smoker  . Smokeless tobacco: Never Used  Substance Use Topics  . Alcohol use: Yes    Alcohol/week: 1.0 standard drinks    Types: 1 Glasses of wine per week    Comment: occas  . Drug use: No     Allergies   Cola (syrup); Eggs or egg-derived products; and Sulfa antibiotics   Review of Systems Review of Systems  Neurological: Positive for weakness.  All other systems reviewed and are negative.    Physical Exam Updated Vital Signs BP (!) 167/90 (BP Location: Left Arm)   Pulse 96   Temp 98.4 F (36.9 C) (Oral)   Resp 18   Ht 5\' 5"  (1.651 m)   Wt 98.9 kg   LMP 05/08/2012   SpO2 99%   BMI 36.28 kg/m   Physical Exam  Constitutional: She is oriented to person, place, and time. She appears well-developed and well-nourished. No distress.  HENT:  Head: Atraumatic.  Right Ear:  External ear normal.  Left Ear: External ear normal.  Eyes: Pupils are equal, round, and reactive to light. Conjunctivae and EOM are normal.  Neck: Normal range of motion. Neck supple.  Cardiovascular: Normal rate and regular rhythm.  Pulmonary/Chest: Effort normal and breath sounds normal.  Abdominal: Soft. She exhibits no distension. There is no tenderness.  Musculoskeletal: She exhibits no edema.  Neurological: She is alert and oriented  to person, place, and time. She has normal strength. No cranial nerve deficit or sensory deficit. She displays a negative Romberg sign. Coordination and gait normal. GCS eye subscore is 4. GCS verbal subscore is 5. GCS motor subscore is 6.  Skin: No rash noted.  Psychiatric: She has a normal mood and affect.  Nursing note and vitals reviewed.    ED Treatments / Results  Labs (all labs ordered are listed, but only abnormal results are displayed) Labs Reviewed  COMPREHENSIVE METABOLIC PANEL - Abnormal; Notable for the following components:      Result Value   Glucose, Bld 268 (*)    AST 14 (*)    All other components within normal limits  CBC - Abnormal; Notable for the following components:   RBC 5.24 (*)    Hemoglobin 16.0 (*)    HCT 46.9 (*)    All other components within normal limits  URINALYSIS, ROUTINE W REFLEX MICROSCOPIC - Abnormal; Notable for the following components:   Glucose, UA >=500 (*)    Ketones, ur 15 (*)    All other components within normal limits  URINALYSIS, MICROSCOPIC (REFLEX) - Abnormal; Notable for the following components:   Bacteria, UA RARE (*)    All other components within normal limits  RAPID URINE DRUG SCREEN, HOSP PERFORMED  TROPONIN I    EKG EKG Interpretation  Date/Time:  Friday November 07 2017 14:29:08 EDT Ventricular Rate:  89 PR Interval:    QRS Duration: 90 QT Interval:  390 QTC Calculation: 475 R Axis:   84 Text Interpretation:  Sinus rhythm Borderline right axis deviation Low voltage,  precordial leads Borderline repolarization abnormality Baseline wander in lead(s) I II aVR No significant change from 06/19/2012 Confirmed by Veryl Speak [phone]) on 11/07/2017 2:37:41 PM   Radiology Ct Head Wo Contrast  Result Date: 11/07/2017 CLINICAL DATA:  Dizziness and unsteady gait this morning. EXAM: CT HEAD WITHOUT CONTRAST TECHNIQUE: Contiguous axial images were obtained from the base of the skull through the vertex without intravenous contrast. COMPARISON:  None. FINDINGS: Brain: Ventricles are within normal limits in size and configuration. Mild chronic small vessel ischemic change noted within the periventricular white matter regions bilaterally. No mass, hemorrhage, edema or other evidence of acute parenchymal abnormality. No extra-axial hemorrhage. Vascular: Chronic calcified atherosclerotic changes of the large vessels at the skull base. No unexpected hyperdense vessel. Skull: Normal. Negative for fracture or focal lesion. Sinuses/Orbits: No acute finding. Other: None. IMPRESSION: 1. No acute findings.  No intracranial mass, hemorrhage or edema. 2. Mild chronic small vessel ischemic change within the white matter. Electronically Signed   By: Franki Cabot M.D.   On: 11/07/2017 15:10    Procedures Procedures (including critical care time)  Medications Ordered in ED Medications - No data to display   Initial Impression / Assessment and Plan / ED Course  I have reviewed the triage vital signs and the nursing notes.  Pertinent labs & imaging results that were available during my care of the patient were reviewed by me and considered in my medical decision making (see chart for details).     BP (!) 167/90 (BP Location: Left Arm)   Pulse 96   Temp 98.4 F (36.9 C) (Oral)   Resp 18   Ht 5\' 5"  (1.651 m)   Wt 98.9 kg   LMP 05/08/2012   SpO2 99%   BMI 36.28 kg/m    Final Clinical Impressions(s) / ED Diagnoses   Final diagnoses:  Near syncope  Hyperglycemia    ED  Discharge Orders    None     2:27 PM Patient here with complaints of "feeling drunk for about 5 minutes" at 4 AM this morning which has completely resolved.  Symptoms suggestive of a possible TIA.  Her ABCD 2 score is 3, which put her at low risk.  Care discussed with Dr. Stark Jock  3:31 PM Normal UDS, CMP with mild elevated glucose of 268 with normal anion gap, normal renal function, normal WBC, normal troponin, urine without signs of urinary tract infection, head CT scan unremarkable, EKG without acute ischemic changes.  Pt is hypertensive at 167/90.  She will need to f/u with PCP for recheck.  Recommend close outpt f/u for further evaluation of her condition.  Normal orthostatic vital sign.   Domenic Moras, PA-C 11/07/17 1540    Veryl Speak, MD 11/13/17 2306

## 2017-11-07 NOTE — ED Triage Notes (Signed)
Pt states  Sudden unsteady gait at 4 am , lasting 1 hr , denies dizziness. Sent here by PMD for eval

## 2017-11-18 ENCOUNTER — Ambulatory Visit
Admission: RE | Admit: 2017-11-18 | Discharge: 2017-11-18 | Disposition: A | Discharge status: Home / Self Care | Payer: Managed Care, Other (non HMO) | Source: Ambulatory Visit | Attending: Family Medicine | Admitting: Family Medicine

## 2017-11-18 DIAGNOSIS — Z1231 Encounter for screening mammogram for malignant neoplasm of breast: Secondary | ICD-10-CM

## 2017-11-19 ENCOUNTER — Other Ambulatory Visit: Payer: Self-pay | Admitting: Family Medicine

## 2017-11-19 DIAGNOSIS — R928 Other abnormal and inconclusive findings on diagnostic imaging of breast: Secondary | ICD-10-CM

## 2017-11-25 ENCOUNTER — Other Ambulatory Visit: Payer: Self-pay | Admitting: Family Medicine

## 2017-11-25 ENCOUNTER — Ambulatory Visit
Admission: RE | Admit: 2017-11-25 | Discharge: 2017-11-25 | Disposition: A | Discharge status: Home / Self Care | Payer: Managed Care, Other (non HMO) | Source: Ambulatory Visit | Attending: Family Medicine | Admitting: Family Medicine

## 2017-11-25 DIAGNOSIS — R928 Other abnormal and inconclusive findings on diagnostic imaging of breast: Secondary | ICD-10-CM

## 2017-11-25 DIAGNOSIS — N6489 Other specified disorders of breast: Secondary | ICD-10-CM

## 2018-05-27 ENCOUNTER — Inpatient Hospital Stay: Admission: RE | Admit: 2018-05-27 | Payer: Managed Care, Other (non HMO) | Source: Ambulatory Visit

## 2018-05-27 ENCOUNTER — Other Ambulatory Visit: Payer: Managed Care, Other (non HMO)

## 2018-07-13 ENCOUNTER — Other Ambulatory Visit: Payer: Managed Care, Other (non HMO)

## 2018-10-06 ENCOUNTER — Telehealth: Payer: Self-pay

## 2018-10-06 NOTE — Telephone Encounter (Signed)

## 2018-10-07 ENCOUNTER — Ambulatory Visit: Payer: Managed Care, Other (non HMO) | Admitting: Family Medicine

## 2018-10-07 ENCOUNTER — Other Ambulatory Visit: Payer: Self-pay

## 2018-10-07 ENCOUNTER — Encounter: Payer: Self-pay | Admitting: Family Medicine

## 2018-10-07 VITALS — BP 130/80 | HR 92 | Ht 65.0 in | Wt 209.0 lb

## 2018-10-07 DIAGNOSIS — Z13220 Encounter for screening for lipoid disorders: Secondary | ICD-10-CM | POA: Diagnosis not present

## 2018-10-07 DIAGNOSIS — I10 Essential (primary) hypertension: Secondary | ICD-10-CM

## 2018-10-07 DIAGNOSIS — R7303 Prediabetes: Secondary | ICD-10-CM

## 2018-10-07 DIAGNOSIS — Z0001 Encounter for general adult medical examination with abnormal findings: Secondary | ICD-10-CM | POA: Diagnosis not present

## 2018-10-07 DIAGNOSIS — A6000 Herpesviral infection of urogenital system, unspecified: Secondary | ICD-10-CM

## 2018-10-07 DIAGNOSIS — F411 Generalized anxiety disorder: Secondary | ICD-10-CM

## 2018-10-07 DIAGNOSIS — E119 Type 2 diabetes mellitus without complications: Secondary | ICD-10-CM | POA: Insufficient documentation

## 2018-10-07 LAB — COMPREHENSIVE METABOLIC PANEL
ALT: 14 U/L (ref 0–35)
AST: 10 U/L (ref 0–37)
Albumin: 3.7 g/dL (ref 3.5–5.2)
Alkaline Phosphatase: 81 U/L (ref 39–117)
BUN: 19 mg/dL (ref 6–23)
CO2: 26 mEq/L (ref 19–32)
Calcium: 9.4 mg/dL (ref 8.4–10.5)
Chloride: 96 mEq/L (ref 96–112)
Creatinine, Ser: 0.87 mg/dL (ref 0.40–1.20)
GFR: 65.92 mL/min (ref >60.00)
Glucose, Bld: 341 mg/dL — ABNORMAL HIGH (ref 70–99)
Potassium: 4.7 mEq/L (ref 3.5–5.1)
Sodium: 132 mEq/L — ABNORMAL LOW (ref 135–145)
Total Bilirubin: 0.5 mg/dL (ref 0.2–1.2)
Total Protein: 7.2 g/dL (ref 6.0–8.3)

## 2018-10-07 LAB — CBC WITH DIFFERENTIAL/PLATELET
Basophils Absolute: 0 10*3/uL (ref 0.0–0.1)
Basophils Relative: 0.6 % (ref 0.0–3.0)
Eosinophils Absolute: 0.3 10*3/uL (ref 0.0–0.7)
Eosinophils Relative: 3.9 % (ref 0.0–5.0)
HCT: 46.1 % — ABNORMAL HIGH (ref 36.0–46.0)
Hemoglobin: 15.7 g/dL — ABNORMAL HIGH (ref 12.0–15.0)
Lymphocytes Relative: 26 % (ref 12.0–46.0)
Lymphs Abs: 2 10*3/uL (ref 0.7–4.0)
MCHC: 34 g/dL (ref 30.0–36.0)
MCV: 91.1 fl (ref 78.0–100.0)
Monocytes Absolute: 0.5 10*3/uL (ref 0.1–1.0)
Monocytes Relative: 6.4 % (ref 3.0–12.0)
Neutro Abs: 4.9 10*3/uL (ref 1.4–7.7)
Neutrophils Relative %: 63.1 % (ref 43.0–77.0)
Platelets: 338 10*3/uL (ref 150.0–400.0)
RBC: 5.06 Mil/uL (ref 3.87–5.11)
RDW: 12.7 % (ref 11.5–15.5)
WBC: 7.8 10*3/uL (ref 4.0–10.5)

## 2018-10-07 LAB — LIPID PANEL
Cholesterol: 221 mg/dL — ABNORMAL HIGH (ref 0–200)
HDL: 45.1 mg/dL (ref >39.00)
NonHDL: 175.51
Total CHOL/HDL Ratio: 5
Triglycerides: 204 mg/dL — ABNORMAL HIGH (ref 0.0–149.0)
VLDL: 40.8 mg/dL — ABNORMAL HIGH (ref 0.0–40.0)

## 2018-10-07 LAB — HEMOGLOBIN A1C: Hgb A1c MFr Bld: 12.3 % — ABNORMAL HIGH (ref 4.6–6.5)

## 2018-10-07 LAB — VITAMIN D 25 HYDROXY (VIT D DEFICIENCY, FRACTURES): VITD: 21.73 ng/mL — ABNORMAL LOW (ref 30.00–100.00)

## 2018-10-07 LAB — TSH: TSH: 1.15 u[IU]/mL (ref 0.35–4.50)

## 2018-10-07 LAB — LDL CHOLESTEROL, DIRECT: Direct LDL: 165 mg/dL

## 2018-10-07 MED ORDER — METFORMIN HCL 500 MG PO TABS: 500.0000 mg | ORAL_TABLET | Freq: Every day | ORAL | 3 refills | Status: DC

## 2018-10-07 MED ORDER — METOPROLOL SUCCINATE ER 100 MG PO TB24: 100.0000 mg | ORAL_TABLET | Freq: Every morning | ORAL | 3 refills | Status: DC

## 2018-10-07 MED ORDER — FAMCICLOVIR 250 MG PO TABS: 250.0000 mg | ORAL_TABLET | Freq: Two times a day (BID) | ORAL | 3 refills | Status: DC

## 2018-10-07 MED ORDER — AMLODIPINE-OLMESARTAN 10-40 MG PO TABS: 1.0000 | ORAL_TABLET | Freq: Every morning | ORAL | 3 refills | Status: DC

## 2018-10-07 MED ORDER — ESCITALOPRAM OXALATE 20 MG PO TABS: 20.0000 mg | ORAL_TABLET | Freq: Every day | ORAL | 3 refills | Status: DC

## 2018-10-07 MED ORDER — ALPRAZOLAM 0.5 MG PO TABS: 0.2500 mg | ORAL_TABLET | Freq: Every day | ORAL | 3 refills | Status: DC | PRN

## 2018-10-07 NOTE — Patient Instructions (Signed)
Great to meet you! I would like to see you back in 6 months for blood pressure and blood sugar follow up.    Preventive Care 62-62 Years Old, Female Preventive care refers to visits with your health care provider and lifestyle choices that can promote health and wellness. This includes:  A yearly physical exam. This may also be called an annual well check.  Regular dental visits and eye exams.  Immunizations.  Screening for certain conditions.  Healthy lifestyle choices, such as eating a healthy diet, getting regular exercise, not using drugs or products that contain nicotine and tobacco, and limiting alcohol use. What can I expect for my preventive care visit? Physical exam Your health care provider will check your:  Height and weight. This may be used to calculate body mass index (BMI), which tells if you are at a healthy weight.  Heart rate and blood pressure.  Skin for abnormal spots. Counseling Your health care provider may ask you questions about your:  Alcohol, tobacco, and drug use.  Emotional well-being.  Home and relationship well-being.  Sexual activity.  Eating habits.  Work and work Statistician.  Method of birth control.  Menstrual cycle.  Pregnancy history. What immunizations do I need?  Influenza (flu) vaccine  This is recommended every year. Tetanus, diphtheria, and pertussis (Tdap) vaccine  You may need a Td booster every 10 years. Varicella (chickenpox) vaccine  You may need this if you have not been vaccinated. Zoster (shingles) vaccine  You may need this after age 62. Measles, mumps, and rubella (MMR) vaccine  You may need at least one dose of MMR if you were born in 1957 or later. You may also need a second dose. Pneumococcal conjugate (PCV13) vaccine  You may need this if you have certain conditions and were not previously vaccinated. Pneumococcal polysaccharide (PPSV23) vaccine  You may need one or two doses if you smoke  cigarettes or if you have certain conditions. Meningococcal conjugate (MenACWY) vaccine  You may need this if you have certain conditions. Hepatitis A vaccine  You may need this if you have certain conditions or if you travel or work in places where you may be exposed to hepatitis A. Hepatitis B vaccine  You may need this if you have certain conditions or if you travel or work in places where you may be exposed to hepatitis B. Haemophilus influenzae type b (Hib) vaccine  You may need this if you have certain conditions. Human papillomavirus (HPV) vaccine  If recommended by your health care provider, you may need three doses over 6 months. You may receive vaccines as individual doses or as more than one vaccine together in one shot (combination vaccines). Talk with your health care provider about the risks and benefits of combination vaccines. What tests do I need? Blood tests  Lipid and cholesterol levels. These may be checked every 5 years, or more frequently if you are over 5 years old.  Hepatitis C test.  Hepatitis B test. Screening  Lung cancer screening. You may have this screening every year starting at age 62 if you have a 30-pack-year history of smoking and currently smoke or have quit within the past 15 years.  Colorectal cancer screening. All adults should have this screening starting at age 62 and continuing until age 26. Your health care provider may recommend screening at age 62 if you are at increased risk. You will have tests every 1-10 years, depending on your results and the type of screening test.  Diabetes screening. This is done by checking your blood sugar (glucose) after you have not eaten for a while (fasting). You may have this done every 1-3 years.  Mammogram. This may be done every 1-2 years. Talk with your health care provider about when you should start having regular mammograms. This may depend on whether you have a family history of breast cancer.   BRCA-related cancer screening. This may be done if you have a family history of breast, ovarian, tubal, or peritoneal cancers.  Pelvic exam and Pap test. This may be done every 3 years starting at age 62. Starting at age 96, this may be done every 5 years if you have a Pap test in combination with an HPV test. Other tests  Sexually transmitted disease (STD) testing.  Bone density scan. This is done to screen for osteoporosis. You may have this scan if you are at high risk for osteoporosis. Follow these instructions at home: Eating and drinking  Eat a diet that includes fresh fruits and vegetables, whole grains, lean protein, and low-fat dairy.  Take vitamin and mineral supplements as recommended by your health care provider.  Do not drink alcohol if: ? Your health care provider tells you not to drink. ? You are pregnant, may be pregnant, or are planning to become pregnant.  If you drink alcohol: ? Limit how much you have to 0-1 drink a day. ? Be aware of how much alcohol is in your drink. In the [place] one drink equals one 12 oz bottle of beer (355 mL), one 5 oz glass of wine (148 mL), or one 1 oz glass of hard liquor (44 mL). Lifestyle  Take daily care of your teeth and gums.  Stay active. Exercise for at least 30 minutes on 5 or more days each week.  Do not use any products that contain nicotine or tobacco, such as cigarettes, e-cigarettes, and chewing tobacco. If you need help quitting, ask your health care provider.  If you are sexually active, practice safe sex. Use a condom or other form of birth control (contraception) in order to prevent pregnancy and STIs (sexually transmitted infections).  If told by your health care provider, take low-dose aspirin daily starting at age 71. What's next?  Visit your health care provider once a year for a well check visit.  Ask your health care provider how often you should have your eyes and teeth checked.  Stay up to date on all  vaccines. This information is not intended to replace advice given to you by your health care provider. Make sure you discuss any questions you have with your health care provider. Document Released: 02/10/2015 Document Revised: 09/25/2017 Document Reviewed: 09/25/2017 Elsevier Patient Education  2020 [employer].

## 2018-10-07 NOTE — Assessment & Plan Note (Signed)
Orders Placed This Encounter  Procedures  . Comp Met (CMET)  . CBC w/Diff  . Lipid panel  . TSH  . Vitamin D (25 hydroxy)  . Hemoglobin A1c  Screenings: UTD Immunizations:  Declines influenza vaccine Anticipatory guidance/RIsk factor Reduction:  Recommendations per AVS

## 2018-10-07 NOTE — Assessment & Plan Note (Signed)
-  Update a1c today.  -Discussed weight loss and increased activity.  Has upcoming appt with [facility]

## 2018-10-07 NOTE — Assessment & Plan Note (Signed)
-  Discussed starting suppressive therapy for HSV which she agrees she would like to start given increased frequency of outbreaks.   -Change famvir to 250mg  BID.

## 2018-10-07 NOTE — Assessment & Plan Note (Signed)
-  BP is well controlled with current medication.  Will continue at current dose.  -Labs updated today.

## 2018-10-07 NOTE — Assessment & Plan Note (Signed)
-  Stable with lexapro and xanax prn.  -PDMP reviewed.   -Medications renewed.

## 2018-10-07 NOTE — Progress Notes (Signed)
Aiman Schlies - 63 y.o. female MRN [id number]  Date of birth: [birth year]  Subjective Chief Complaint  Patient presents with  . Establish Care    HPI Auria Dembek is a 63 y.o. female with history of HTN, prediabetes, anxiety, endometrial cancer s/p hysterectomy and recurrent HSV here today for initial visit and CPE.  She reports that chronic medical conditions have been fairly stable with current medications.  -HTN: Current treatment with Azor and toprol xl.  BP is well controlled with current medications and she is tolerating well.   -Prediabetes:  Current tx with metformin.  She feels like her blood sugars are not as well controlled as they have been previously.  She is planning on seeing healthy weight and wellness clinic.   -anxiety:  Current treatment with lexapro with alprazolam on rare occasion for severe anxiety.  Current medications working well for her.   -HSV:  Currently taking famvir as needed for outbreaks however outbreaks have become more frequent and she typically gets prodrome of ear thumping sensation prior to facial outbreak.    She is a lifelong non-smoker.  She drinks EtOH occasionally.  She is widowed.    Review of Systems  Constitutional: Negative for chills, fever, malaise/fatigue and weight loss.  HENT: Negative for congestion, ear pain and sore throat.   Eyes: Negative for blurred vision, double vision and pain.  Respiratory: Negative for cough and shortness of breath.   Cardiovascular: Negative for chest pain and palpitations.  Gastrointestinal: Negative for abdominal pain, blood in stool, constipation, heartburn and nausea.  Genitourinary: Negative for dysuria and urgency.  Musculoskeletal: Negative for joint pain and myalgias.  Neurological: Negative for dizziness and headaches.  Endo/Heme/Allergies: Does not bruise/bleed easily.  Psychiatric/Behavioral: Negative for depression. The patient is not nervous/anxious and does not have insomnia.      Allergies  Allergen Reactions  . Cola (Syrup) Anaphylaxis  . Sulfa Antibiotics Hives and Rash    Past Medical History:  Diagnosis Date  . Anxiety   . Cancer (HCC)    endometrial  . Complication of anesthesia    slow to awaken in past  . Depression   . Hypertension   . Kidney, benign tumor checked every 5 years   right kidney spot  . Urticaria     Past Surgical History:  Procedure Laterality Date  . APPENDECTOMY    . BTL    . HIATAL HERNIA REPAIR  12/1999  . ROBOTIC ASSISTED TOTAL HYSTERECTOMY WITH BILATERAL SALPINGO OOPHERECTOMY Bilateral 06/23/2012   Procedure: ROBOTIC ASSISTED TOTAL HYSTERECTOMY WITH BILATERAL SALPINGO OOPHORECTOMY  LYMPH NODES, POSSIBLE LAPAROTOMY ;  Surgeon: Rejeana Brock A. Duard Brady, MD;  Location: [facility];  Service: Gynecology;  Laterality: Bilateral;  . TUBAL LIGATION      Social History   Socioeconomic History  . Marital status: Widowed    Spouse name: Not on file  . Number of children: Not on file  . Years of education: Not on file  . Highest education level: Not on file  Occupational History  . Not on file  Social Needs  . Financial resource strain: Not on file  . Food insecurity    Worry: Not on file    Inability: Not on file  . Transportation needs    Medical: Not on file    Non-medical: Not on file  Tobacco Use  . Smoking status: Never Smoker  . Smokeless tobacco: Never Used  Substance and Sexual Activity  . Alcohol use: Yes    Alcohol/week:  1.0 standard drinks    Types: 1 Glasses of wine per week    Comment: occas  . Drug use: No  . Sexual activity: Never  Lifestyle  . Physical activity    Days per week: Not on file    Minutes per session: Not on file  . Stress: Not on file  Relationships  . Social Musician on phone: Not on file    Gets together: Not on file    Attends religious service: Not on file    Active member of club or organization: Not on file    Attends meetings of clubs or organizations: Not on file     Relationship status: Not on file  Other Topics Concern  . Not on file  Social History Narrative  . Not on file    Family History  Problem Relation Age of Onset  . Hypertension Mother   . Diabetes Father   . Cancer Father   . Hypertension Father   . Hypertension Sister   . Cancer Brother   . Hypertension Brother   . Cancer Paternal Aunt   . Cancer Maternal Grandmother   . Cancer Paternal Grandmother     Health Maintenance  Topic Date Due  . HIV Screening  07/06/1971  . TETANUS/TDAP  07/06/1975  . PAP SMEAR-Modifier  07/05/1977  . INFLUENZA VACCINE  04/28/2019 (Originally 08/29/2018)  . MAMMOGRAM  11/19/2019  . COLONOSCOPY  10/13/2022  . Hepatitis C Screening  Completed    ----------------------------------------------------------------------------------------------------------------------------------------------------------------------------------------------------------------- Physical Exam BP 130/80   Pulse 92   Ht 5\' 5"  (1.651 m)   Wt 209 lb (94.8 kg)   LMP 05/08/2012   SpO2 96%   BMI 34.78 kg/m   Physical Exam Constitutional:      General: She is not in acute distress. HENT:     Head: Normocephalic and atraumatic.     Right Ear: Tympanic membrane normal.     Left Ear: Tympanic membrane normal.     Nose: Nose normal.     Mouth/Throat:     Mouth: Mucous membranes are moist.  Eyes:     General: No scleral icterus.    Conjunctiva/sclera: Conjunctivae normal.  Neck:     Musculoskeletal: Normal range of motion and neck supple.     Thyroid: No thyromegaly.  Cardiovascular:     Rate and Rhythm: Normal rate and regular rhythm.     Heart sounds: Normal heart sounds.  Pulmonary:     Effort: Pulmonary effort is normal.     Breath sounds: Normal breath sounds.  Abdominal:     General: Bowel sounds are normal. There is no distension.     Palpations: Abdomen is soft.     Tenderness: There is no abdominal tenderness. There is no guarding.  Musculoskeletal: Normal  range of motion.  Lymphadenopathy:     Cervical: No cervical adenopathy.  Skin:    General: Skin is warm and dry.     Findings: No rash.  Neurological:     General: No focal deficit present.     Mental Status: She is alert and oriented to person, place, and time.     Cranial Nerves: No cranial nerve deficit.     Coordination: Coordination normal.  Psychiatric:        Mood and Affect: Mood normal.        Behavior: Behavior normal.     ------------------------------------------------------------------------------------------------------------------------------------------------------------------------------------------------------------------- Assessment and Plan  Genital herpes -Discussed starting suppressive therapy for HSV which she agrees  she would like to start given increased frequency of outbreaks.   -Change famvir to 250mg  BID.    Essential hypertension -BP is well controlled with current medication.  Will continue at current dose.  -Labs updated today.   Prediabetes -Update a1c today.  -Discussed weight loss and increased activity.  Has upcoming appt with healthy weight and wellness clinic  GAD (generalized anxiety disorder) -Stable with lexapro and xanax prn.  -PDMP reviewed.   -Medications renewed.   Encounter for routine adult physical exam with abnormal findings Orders Placed This Encounter  Procedures  . Comp Met (CMET)  . CBC w/Diff  . Lipid panel  . TSH  . Vitamin D (25 hydroxy)  . Hemoglobin A1c  Screenings: UTD Immunizations:  Declines influenza vaccine Anticipatory guidance/RIsk factor Reduction:  Recommendations per AVS

## 2018-10-12 ENCOUNTER — Other Ambulatory Visit: Payer: Self-pay | Admitting: Family Medicine

## 2018-10-12 MED ORDER — METFORMIN HCL 1000 MG PO TABS: 1000.0000 mg | ORAL_TABLET | Freq: Two times a day (BID) | ORAL | 1 refills | Status: DC

## 2018-10-12 MED ORDER — ATORVASTATIN CALCIUM 40 MG PO TABS: 40.0000 mg | ORAL_TABLET | Freq: Every day | ORAL | 1 refills | Status: DC

## 2018-10-12 NOTE — Progress Notes (Signed)
-  Diabetes is not well controlled with current a1c of 12.3%.   I would recommend increasing her metformin and adding another medication called jardiance.  She should also follow a low carb diet with regular exercise.   -Cholesterol is also elevated.  With history of diabetes, hypertension and high cholesterol her risk of heart attack of stroke is high.  I would recommend that she start a medication to lower this. -Vitamin d is also low, recommend 2000 IU daily.  She may get this OTC.    If ok with starting other medications please let me know.  Let's plan for 3 month f/u to recheck a1c.    Thanks!

## 2018-10-15 ENCOUNTER — Ambulatory Visit: Payer: Managed Care, Other (non HMO) | Admitting: Family Medicine

## 2018-10-15 ENCOUNTER — Other Ambulatory Visit: Payer: Self-pay

## 2018-10-15 ENCOUNTER — Encounter: Payer: Self-pay | Admitting: Family Medicine

## 2018-10-15 VITALS — BP 116/80 | HR 81 | Temp 98.2°F | Ht 65.0 in | Wt 210.8 lb

## 2018-10-15 DIAGNOSIS — E785 Hyperlipidemia, unspecified: Secondary | ICD-10-CM | POA: Diagnosis not present

## 2018-10-15 DIAGNOSIS — E119 Type 2 diabetes mellitus without complications: Secondary | ICD-10-CM

## 2018-10-15 DIAGNOSIS — E559 Vitamin D deficiency, unspecified: Secondary | ICD-10-CM | POA: Diagnosis not present

## 2018-10-15 DIAGNOSIS — E1169 Type 2 diabetes mellitus with other specified complication: Secondary | ICD-10-CM | POA: Diagnosis not present

## 2018-10-15 NOTE — Progress Notes (Signed)
[name] - 63 y.o. female MRN [id number]  Date of birth: 1955/05/23  Subjective Chief Complaint  Patient presents with  . medication consult    wants to discuss medications    HPI [name] is a 63 y.o. female with history of HTN, T2DM, and HLD here today to follow up on medications.  She was recently seen and a1c elevated at 12.3%.  She agreed to increase in metformin but declined to add any other medications at this time.  She is planning on seeing healthy weight and wellness clinic as well to help with her weight and blood sugar control.  She is concerned about increase in metformin as she has had some GI side effects.  She has currently increased to 500mg  bid and is having some diarrhea.  She would like to know if she can gradually increase this over the next few weeks.    She also has questions about statin and necessity of this as well as need for liver monitoring.    She did also pick up vitamin d supplement, 5000 IU.  She would like to know if this is ok to take.  ROS:  A comprehensive ROS was completed and negative except as noted per HPI  Allergies  Allergen Reactions  . Cola (Syrup) Anaphylaxis  . Sulfa Antibiotics Hives and Rash    Past Medical History:  Diagnosis Date  . Anxiety   . Cancer (HCC)    endometrial  . Complication of anesthesia    slow to awaken in past  . Depression   . Hypertension   . Kidney, benign tumor checked every 5 years   right kidney spot  . Urticaria     Past Surgical History:  Procedure Laterality Date  . APPENDECTOMY    . BTL    . HIATAL HERNIA REPAIR  12/1999  . ROBOTIC ASSISTED TOTAL HYSTERECTOMY WITH BILATERAL SALPINGO OOPHERECTOMY Bilateral 06/23/2012   Procedure: ROBOTIC ASSISTED TOTAL HYSTERECTOMY WITH BILATERAL SALPINGO OOPHORECTOMY  LYMPH NODES, POSSIBLE LAPAROTOMY ;  Surgeon: Rejeana Brock A. Duard Brady, MD;  Location: [facility];  Service: Gynecology;  Laterality: Bilateral;  . TUBAL LIGATION      Social History    Socioeconomic History  . Marital status: Widowed    Spouse name: Not on file  . Number of children: Not on file  . Years of education: Not on file  . Highest education level: Not on file  Occupational History  . Not on file  Social Needs  . Financial resource strain: Not on file  . Food insecurity    Worry: Not on file    Inability: Not on file  . Transportation needs    Medical: Not on file    Non-medical: Not on file  Tobacco Use  . Smoking status: Never Smoker  . Smokeless tobacco: Never Used  Substance and Sexual Activity  . Alcohol use: Yes    Alcohol/week: 1.0 standard drinks    Types: 1 Glasses of wine per week    Comment: occas  . Drug use: No  . Sexual activity: Never  Lifestyle  . Physical activity    Days per week: Not on file    Minutes per session: Not on file  . Stress: Not on file  Relationships  . Social Musician on phone: Not on file    Gets together: Not on file    Attends religious service: Not on file    Active member of club or organization: Not  on file    Attends meetings of clubs or organizations: Not on file    Relationship status: Not on file  Other Topics Concern  . Not on file  Social History Narrative  . Not on file    Family History  Problem Relation Age of Onset  . Hypertension Mother   . Diabetes Father   . Cancer Father   . Hypertension Father   . Hypertension Sister   . Cancer Brother   . Hypertension Brother   . Cancer Paternal Aunt   . Cancer Maternal Grandmother   . Cancer Paternal Grandmother     Health Maintenance  Topic Date Due  . HIV Screening  07/06/1971  . TETANUS/TDAP  07/06/1975  . INFLUENZA VACCINE  04/28/2019 (Originally 08/29/2018)  . MAMMOGRAM  11/19/2019  . COLONOSCOPY  10/13/2022  . Hepatitis C Screening  Completed  . PAP SMEAR-Modifier  Discontinued     ----------------------------------------------------------------------------------------------------------------------------------------------------------------------------------------------------------------- Physical Exam BP 116/80   Pulse 81   Temp 98.2 F (36.8 C) (Oral)   Ht 5\' 5"  (1.651 m)   Wt 210 lb 12.8 oz (95.6 kg)   LMP 05/08/2012   SpO2 96%   BMI 35.08 kg/m   Physical Exam Constitutional:      Appearance: Normal appearance.  HENT:     Head: Normocephalic and atraumatic.  Eyes:     General: No scleral icterus. Neurological:     General: No focal deficit present.     Mental Status: She is alert.  Psychiatric:        Mood and Affect: Mood normal.        Behavior: Behavior normal.    Additional elements of exam deferred at this time.  ------------------------------------------------------------------------------------------------------------------------------------------------------------------------------------------------------------------- Assessment and Plan  Type 2 diabetes mellitus without complications (HCC) -Discussed that we can gradually increase metformin.  Continue current 500mg  bid x2 weeks then 1000mg  qam and 500mg  qpm x2 weeks before finally increasing to 1000mg  BID.  -She is hopeful to improve her glucose levels with lifestyle change as well.   Hyperlipidemia associated with type 2 diabetes mellitus (HCC) -Current ASCVD risk of 10.1% with history of HTN and T2DM.  Discussed that evidence shows that addition of statin reduces risk of heart attack and stroke.  She agrees to continue.  We'll recheck LFT's at f/u in 3 months.   Vitamin D deficiency -Recommend that she take 5000 IU every other day.    >25 minutes spent with patient with 50% of time spent providing counseling as documented above.

## 2018-10-15 NOTE — Assessment & Plan Note (Signed)
-  Recommend that she take 5000 IU every other day.

## 2018-10-15 NOTE — Assessment & Plan Note (Signed)
-  Current ASCVD risk of 10.1% with history of HTN and T2DM.  Discussed that evidence shows that addition of statin reduces risk of heart attack and stroke.  She agrees to continue.  We'll recheck LFT's at f/u in 3 months.

## 2018-10-15 NOTE — Assessment & Plan Note (Signed)
-  Discussed that we can gradually increase metformin.  Continue current 500mg  bid x2 weeks then 1000mg  qam and 500mg  qpm x2 weeks before finally increasing to 1000mg  BID.  -She is hopeful to improve her glucose levels with lifestyle change as well.

## 2019-01-11 ENCOUNTER — Ambulatory Visit: Payer: Managed Care, Other (non HMO) | Admitting: Family Medicine

## 2019-01-18 ENCOUNTER — Encounter: Payer: Self-pay | Admitting: Family Medicine

## 2019-01-18 ENCOUNTER — Telehealth (INDEPENDENT_AMBULATORY_CARE_PROVIDER_SITE_OTHER): Payer: Managed Care, Other (non HMO) | Admitting: Family Medicine

## 2019-01-18 VITALS — Ht 65.0 in

## 2019-01-18 DIAGNOSIS — E119 Type 2 diabetes mellitus without complications: Secondary | ICD-10-CM

## 2019-01-18 DIAGNOSIS — E1169 Type 2 diabetes mellitus with other specified complication: Secondary | ICD-10-CM | POA: Diagnosis not present

## 2019-01-18 DIAGNOSIS — I10 Essential (primary) hypertension: Secondary | ICD-10-CM | POA: Diagnosis not present

## 2019-01-18 DIAGNOSIS — E785 Hyperlipidemia, unspecified: Secondary | ICD-10-CM | POA: Diagnosis not present

## 2019-01-18 NOTE — Patient Instructions (Signed)
Health Maintenance Due  Topic Date Due  . PNEUMOCOCCAL POLYSACCHARIDE VACCINE AGE 62-64 HIGH RISK  07/06/1958  . FOOT EXAM  07/06/1966  . OPHTHALMOLOGY EXAM  07/06/1966  . HIV Screening  07/06/1971  . TETANUS/TDAP  07/06/1975    No flowsheet data found.

## 2019-01-18 NOTE — Progress Notes (Signed)
[name] - 63 y.o. female MRN [id number]  Date of birth: 11/18/1955   This visit type was conducted due to national recommendations for restrictions regarding the COVID-19 Pandemic (e.g. social distancing).  This format is felt to be most appropriate for this patient at this time.  All issues noted in this document were discussed and addressed.  No physical exam was performed (except for noted visual exam findings with Video Visits).  I discussed the limitations of evaluation and management by telemedicine and the availability of in person appointments. The patient expressed understanding and agreed to proceed.  I connected with@ on 01/18/19 at  9:20 AM EST by a video enabled telemedicine application and verified that I am speaking with the correct person using two identifiers.  Present at visit: Everrett Coombe, DO Isac Caddy Rodwell   Patient Location: Home [address] [place] [address]   Provider location:   [facility]  Chief Complaint  Patient presents with  . Follow-up    DM    HPI  [name] is a 63 y.o. female who presents via audio/video conferencing for a telehealth visit today.  She is following up today for HTN, T2DM and HLD.  She reports that she is doing well.  She has tried to increase her metformin to 1000mg  BID but has been unable to tolerate the increase.  Currently she is taking 1000mg  qam and 500mg  qpm.  She report that blood sugars have been doing well.  She denies increased thirst or urination.  She has been able to lose about 10-15lbs since her last visit.    She thinks her BP has been good but has not checked at home. She denies headache, vision changes, chest pain, shortness of breath.    She id tolerating atorvastatin well.  She denies increased myalgias or GI upset with this.     ROS:  A comprehensive ROS was completed and negative except as noted per HPI  Past Medical History:  Diagnosis Date  . Anxiety    . Cancer (HCC)    endometrial  . Complication of anesthesia    slow to awaken in past  . Depression   . Hypertension   . Kidney, benign tumor checked every 5 years   right kidney spot  . Urticaria     Past Surgical History:  Procedure Laterality Date  . APPENDECTOMY    . BTL    . HIATAL HERNIA REPAIR  12/1999  . ROBOTIC ASSISTED TOTAL HYSTERECTOMY WITH BILATERAL SALPINGO OOPHERECTOMY Bilateral 06/23/2012   Procedure: ROBOTIC ASSISTED TOTAL HYSTERECTOMY WITH BILATERAL SALPINGO OOPHORECTOMY  LYMPH NODES, POSSIBLE LAPAROTOMY ;  Surgeon: Rejeana Brock A. Duard Brady, MD;  Location: [facility];  Service: Gynecology;  Laterality: Bilateral;  . TUBAL LIGATION      Family History  Problem Relation Age of Onset  . Hypertension Mother   . Diabetes Father   . Cancer Father   . Hypertension Father   . Hypertension Sister   . Cancer Brother   . Hypertension Brother   . Cancer Paternal Aunt   . Cancer Maternal Grandmother   . Cancer Paternal Grandmother     Social History   Socioeconomic History  . Marital status: Widowed    Spouse name: Not on file  . Number of children: Not on file  . Years of education: Not on file  . Highest education level: Not on file  Occupational History  . Not on file  Tobacco Use  . Smoking  status: Never Smoker  . Smokeless tobacco: Never Used  Substance and Sexual Activity  . Alcohol use: Yes    Alcohol/week: 1.0 standard drinks    Types: 1 Glasses of wine per week    Comment: occas  . Drug use: No  . Sexual activity: Never  Other Topics Concern  . Not on file  Social History Narrative  . Not on file   Social Determinants of Health   Financial Resource Strain:   . Difficulty of Paying Living Expenses: Not on file  Food Insecurity:   . Worried About Programme researcher, broadcasting/film/video in the Last Year: Not on file  . Ran Out of Food in the Last Year: Not on file  Transportation Needs:   . Lack of Transportation (Medical): Not on file  . Lack of Transportation  (Non-Medical): Not on file  Physical Activity:   . Days of Exercise per Week: Not on file  . Minutes of Exercise per Session: Not on file  Stress:   . Feeling of Stress : Not on file  Social Connections:   . Frequency of Communication with Friends and Family: Not on file  . Frequency of Social Gatherings with Friends and Family: Not on file  . Attends Religious Services: Not on file  . Active Member of Clubs or Organizations: Not on file  . Attends Banker Meetings: Not on file  . Marital Status: Not on file  Intimate Partner Violence:   . Fear of Current or Ex-Partner: Not on file  . Emotionally Abused: Not on file  . Physically Abused: Not on file  . Sexually Abused: Not on file     Current Outpatient Medications:  .  ALPRAZolam (XANAX) 0.5 MG tablet, Take 0.5 tablets (0.25 mg total) by mouth daily as needed for sleep or anxiety. 1/2 tab to 1 tab, Disp: 30 tablet, Rfl: 3 .  amLODipine-olmesartan (AZOR) 10-40 MG tablet, Take 1 tablet by mouth every morning., Disp: 90 tablet, Rfl: 3 .  atorvastatin (LIPITOR) 40 MG tablet, Take 1 tablet (40 mg total) by mouth daily., Disp: 90 tablet, Rfl: 1 .  escitalopram (LEXAPRO) 20 MG tablet, Take 1 tablet (20 mg total) by mouth daily., Disp: 90 tablet, Rfl: 3 .  esomeprazole (NEXIUM) 20 MG capsule, Take 20 mg by mouth daily at 12 noon., Disp: , Rfl:  .  famciclovir (FAMVIR) 250 MG tablet, Take 1 tablet (250 mg total) by mouth 2 (two) times daily., Disp: 180 tablet, Rfl: 3 .  FLAXSEED, LINSEED, PO, With omega 3., Disp: , Rfl:  .  metFORMIN (GLUCOPHAGE) 1000 MG tablet, Take 1 tablet (1,000 mg total) by mouth 2 (two) times daily with a meal. (Patient taking differently: Take 1,000 mg by mouth 2 (two) times daily with a meal. Pt taking 1500mg  daily), Disp: 180 tablet, Rfl: 1 .  metoprolol succinate (TOPROL-XL) 100 MG 24 hr tablet, Take 1 tablet (100 mg total) by mouth every morning. Take with or immediately following a meal., Disp: 90  tablet, Rfl: 3  EXAM:  VITALS per patient if applicable: Ht 5\' 5"  (1.651 m)   LMP 05/08/2012   BMI 35.08 kg/m   GENERAL: alert, oriented, appears well and in no acute distress  HEENT: atraumatic, conjunttiva clear, no obvious abnormalities on inspection of external nose and ears  NECK: normal movements of the head and neck  LUNGS: on inspection no signs of respiratory distress, breathing rate appears normal, no obvious gross SOB, gasping or wheezing  CV: no  obvious cyanosis  MS: moves all visible extremities without noticeable abnormality  PSYCH/NEURO: pleasant and cooperative, no obvious depression or anxiety, speech and thought processing grossly intact  ASSESSMENT AND PLAN:  Discussed the following assessment and plan:  Type 2 diabetes mellitus without complications (HCC) Continue metformin at current dose, update a1c.  She may be able to stay at the 1500mg .    Congratulated on weight loss and encouraged to keep this up.  Follow low carb diet with regular exercise.  Recommend influenza and pneumovax vaccines however she declines these at this time.   Hyperlipidemia associated with type 2 diabetes mellitus (HCC) Tolerating atorvastatin well  Update lipid and LFT's panels.   Essential hypertension -BP has been stable with current medication, continue.  -recommend low salt diet     I discussed the assessment and treatment plan with the patient. The patient was provided an opportunity to ask questions and all were answered. The patient agreed with the plan and demonstrated an understanding of the instructions.   The patient was advised to call back or seek an in-person evaluation if the symptoms worsen or if the condition fails to improve as anticipated.    Everrett Coombe, DO

## 2019-01-18 NOTE — Assessment & Plan Note (Signed)
Continue metformin at current dose, update a1c.  She may be able to stay at the 1500mg .    Congratulated on weight loss and encouraged to keep this up.  Follow low carb diet with regular exercise.  Recommend influenza and pneumovax vaccines however she declines these at this time.

## 2019-01-18 NOTE — Assessment & Plan Note (Signed)
Tolerating atorvastatin well  Update lipid and LFT's panels.

## 2019-01-18 NOTE — Assessment & Plan Note (Signed)
-  BP has been stable with current medication, continue.  -recommend low salt diet

## 2019-01-20 ENCOUNTER — Other Ambulatory Visit: Payer: Self-pay

## 2019-01-25 ENCOUNTER — Ambulatory Visit: Payer: Managed Care, Other (non HMO) | Attending: Internal Medicine

## 2019-01-25 DIAGNOSIS — U071 COVID-19: Secondary | ICD-10-CM

## 2019-01-27 LAB — NOVEL CORONAVIRUS, NAA: SARS-CoV-2, NAA: NOT DETECTED

## 2019-02-24 ENCOUNTER — Telehealth: Payer: Self-pay | Admitting: Family Medicine

## 2019-02-24 NOTE — Telephone Encounter (Signed)
Patient is calling and wanting to [facility] from Dr. Zigmund Daniel to Dr. Bryan Lemma due to Dr. Zigmund Daniel leaving the practice. Pls advise [phone]

## 2019-03-23 NOTE — Telephone Encounter (Signed)
MC-Pt is wanting to schedule TOC visit with you as Dr. Zigmund Daniel is no longer with practice/plz advise/thx dmf

## 2019-03-24 NOTE — Telephone Encounter (Signed)
Plz sched pt a TOC visit with Dr. C/thx dmf

## 2019-03-24 NOTE — Telephone Encounter (Signed)
Ok with me 

## 2019-03-26 NOTE — Telephone Encounter (Signed)
Pt is aware and said she isnt sure if she wants to see her based on provider availability but she will call back and let us know

## 2019-05-01 ENCOUNTER — Other Ambulatory Visit: Payer: Self-pay | Admitting: Family Medicine

## 2019-05-03 NOTE — Telephone Encounter (Signed)
CM-Plz see refill req/thx dmf 

## 2019-05-25 ENCOUNTER — Other Ambulatory Visit: Payer: Self-pay | Admitting: Family Medicine

## 2019-05-26 NOTE — Telephone Encounter (Signed)
Dr. Loletha Grayer please advise, last ov with Dr. Zigmund Daniel was 10/07/2018, last refill 10/07/2018--30 tabs with 3 refills. No appt with new PCP yet.   PMP checked last 3 pick up from [facility]  10/07/2018--30 tab--Dr. Zigmund Daniel 04/10/2018--14 tab--Dr. Precious Haws 03/06/2018--14 tab--Dr. Precious Haws.

## 2019-05-26 NOTE — Telephone Encounter (Signed)
Pt needs appt (VV or in-person) to discuss med/refills

## 2019-05-27 ENCOUNTER — Other Ambulatory Visit: Payer: Self-pay

## 2019-05-27 ENCOUNTER — Other Ambulatory Visit: Payer: Self-pay | Admitting: Family Medicine

## 2019-05-27 MED ORDER — ATORVASTATIN CALCIUM 40 MG PO TABS: 40.0000 mg | ORAL_TABLET | Freq: Every day | ORAL | 0 refills | Status: DC

## 2019-05-27 NOTE — Telephone Encounter (Signed)
Pt request refill for atorvastatin send in until she see new PCP--rx send in for 30 tab.

## 2019-05-28 ENCOUNTER — Encounter: Payer: Self-pay | Admitting: Family Medicine

## 2019-06-01 ENCOUNTER — Other Ambulatory Visit: Payer: Self-pay

## 2019-06-02 ENCOUNTER — Ambulatory Visit: Payer: No Typology Code available for payment source | Admitting: Family Medicine

## 2019-06-02 ENCOUNTER — Encounter: Payer: Self-pay | Admitting: Family Medicine

## 2019-06-02 VITALS — BP 122/80 | HR 98 | Temp 97.2°F | Ht 65.0 in | Wt 214.8 lb

## 2019-06-02 DIAGNOSIS — F411 Generalized anxiety disorder: Secondary | ICD-10-CM

## 2019-06-02 DIAGNOSIS — E785 Hyperlipidemia, unspecified: Secondary | ICD-10-CM

## 2019-06-02 DIAGNOSIS — E1169 Type 2 diabetes mellitus with other specified complication: Secondary | ICD-10-CM

## 2019-06-02 DIAGNOSIS — I10 Essential (primary) hypertension: Secondary | ICD-10-CM

## 2019-06-02 DIAGNOSIS — E119 Type 2 diabetes mellitus without complications: Secondary | ICD-10-CM | POA: Diagnosis not present

## 2019-06-02 MED ORDER — BUSPIRONE HCL 7.5 MG PO TABS: ORAL_TABLET | ORAL | 1 refills | Status: DC

## 2019-06-02 MED ORDER — ATORVASTATIN CALCIUM 40 MG PO TABS: 40.0000 mg | ORAL_TABLET | Freq: Every day | ORAL | 11 refills | Status: DC

## 2019-06-02 NOTE — Progress Notes (Signed)
[name] is a 64 y.o. female  Chief Complaint  Patient presents with  . Establish Care    Pt here for TOC visit.  Pt requesting lab work and also a new rx for Lipitor    HPI: [name] is a 64 y.o. female here for f/u on DM, hyperlipidemia, HTN. She is due for labs.  She needs a refill of her lipitor.  Pt has a PMHx herpes simplex, on famvir.  Pt feels her anxiety is not well controlled in the past month or so. She notes more symptoms (palpitations, PVC's) mostly at night.  She has been on zoloft in the past - did not do well with this.    Past Medical History:  Diagnosis Date  . Anxiety   . Cancer ([place])    endometrial  . Complication of anesthesia    slow to awaken in past  . Depression   . Hypertension   . Kidney, benign tumor checked every 5 years   right kidney spot  . Urticaria     Past Surgical History:  Procedure Laterality Date  . APPENDECTOMY    . BTL    . HIATAL HERNIA REPAIR  12/1999  . ROBOTIC ASSISTED TOTAL HYSTERECTOMY WITH BILATERAL SALPINGO OOPHERECTOMY Bilateral 06/23/2012   Procedure: ROBOTIC ASSISTED TOTAL HYSTERECTOMY WITH BILATERAL SALPINGO OOPHORECTOMY  LYMPH NODES, POSSIBLE LAPAROTOMY ;  Surgeon: Imagene Gurney A. Alycia Rossetti, MD;  Location: [facility];  Service: Gynecology;  Laterality: Bilateral;  . TUBAL LIGATION      Social History   Socioeconomic History  . Marital status: Widowed    Spouse name: Not on file  . Number of children: Not on file  . Years of education: Not on file  . Highest education level: Not on file  Occupational History  . Not on file  Tobacco Use  . Smoking status: Never Smoker  . Smokeless tobacco: Never Used  Substance and Sexual Activity  . Alcohol use: Yes    Alcohol/week: 1.0 standard drinks    Types: 1 Glasses of wine per week    Comment: occas  . Drug use: No  . Sexual activity: Never  Other Topics Concern  . Not on file  Social History Narrative  . Not on file   Social Determinants of Health    Financial Resource Strain:   . Difficulty of Paying Living Expenses:   Food Insecurity:   . Worried About Charity fundraiser in the Last Year:   . Arboriculturist in the Last Year:   Transportation Needs:   . Film/video editor (Medical):   Marland Kitchen Lack of Transportation (Non-Medical):   Physical Activity:   . Days of Exercise per Week:   . Minutes of Exercise per Session:   Stress:   . Feeling of Stress :   Social Connections:   . Frequency of Communication with Friends and Family:   . Frequency of Social Gatherings with Friends and Family:   . Attends Religious Services:   . Active Member of Clubs or Organizations:   . Attends Archivist Meetings:   Marland Kitchen Marital Status:   Intimate Partner Violence:   . Fear of Current or Ex-Partner:   . Emotionally Abused:   Marland Kitchen Physically Abused:   . Sexually Abused:     Family History  Problem Relation Age of Onset  . Hypertension Mother   . Diabetes Father   . Cancer Father   . Hypertension Father   . Hypertension  Sister   . Cancer Brother   . Hypertension Brother   . Cancer Paternal Aunt   . Cancer Maternal Grandmother   . Cancer Paternal Grandmother       There is no immunization history on file for this patient.  Outpatient Encounter Medications as of 06/02/2019  Medication Sig  . ALPRAZolam (XANAX) 0.5 MG tablet Take 0.5 tablets (0.25 mg total) by mouth daily as needed for sleep or anxiety. 1/2 tab to 1 tab  . amLODipine-olmesartan (AZOR) 10-40 MG tablet Take 1 tablet by mouth every morning.  Marland Kitchen atorvastatin (LIPITOR) 40 MG tablet Take 1 tablet (40 mg total) by mouth daily.  Marland Kitchen escitalopram (LEXAPRO) 20 MG tablet Take 1 tablet (20 mg total) by mouth daily.  Marland Kitchen esomeprazole (NEXIUM) 20 MG capsule Take 20 mg by mouth daily at 12 noon.  . famciclovir (FAMVIR) 250 MG tablet Take 1 tablet (250 mg total) by mouth 2 (two) times daily.  Marland Kitchen FLAXSEED, LINSEED, PO With omega 3.  . metoprolol succinate (TOPROL-XL) 100 MG 24 hr  tablet Take 1 tablet (100 mg total) by mouth every morning. Take with or immediately following a meal.  . metFORMIN (GLUCOPHAGE) 1000 MG tablet Take 1 tablet (1,000 mg total) by mouth 2 (two) times daily with a meal. (Patient taking differently: Take 1,000 mg by mouth 2 (two) times daily with a meal. Pt taking 1500mg  daily)   No facility-administered encounter medications on file as of 06/02/2019.     ROS: Pertinent positives and negatives noted in HPI. Remainder of ROS non-contributory    Allergies  Allergen Reactions  . Cola (Syrup) Anaphylaxis  . Sulfa Antibiotics Hives and Rash    BP 122/80 (BP Location: Left Arm, Patient Position: Sitting, Cuff Size: Normal)   Pulse 98   Temp (!) 97.2 F (36.2 C) (Temporal)   Ht 5\' 5"  (1.651 m)   Wt 214 lb 12.8 oz (97.4 kg)   LMP 05/08/2012   SpO2 96%   BMI 35.74 kg/m   BP Readings from Last 3 Encounters:  06/02/19 122/80  10/15/18 116/80  10/07/18 130/80   Pulse Readings from Last 3 Encounters:  06/02/19 98  10/15/18 81  10/07/18 92   Wt Readings from Last 3 Encounters:  06/02/19 214 lb 12.8 oz (97.4 kg)  10/15/18 210 lb 12.8 oz (95.6 kg)  10/07/18 209 lb (94.8 kg)    Physical Exam  Constitutional: She is oriented to person, place, and time. She appears well-developed and well-nourished. No distress.  Cardiovascular: Normal rate and regular rhythm.  Pulmonary/Chest: No respiratory distress.  Neurological: She is alert and oriented to person, place, and time.  Psychiatric: She has a normal mood and affect. Her behavior is normal.     A/P:  1. Type 2 diabetes mellitus without complication, without long-term current use of insulin (HCC) Lab Results  Component Value Date   HGBA1C 12.3 (H) 10/07/2018   - pt lost weight and then gained it back - cont current meds until results of today's labs available - cont on statin, ARB, asa - Hemoglobin A1c - Microalbumin / creatinine urine ratio - Comprehensive metabolic panel -  f/u [id number] or sooner PRN  2. Hyperlipidemia associated with type 2 diabetes mellitus (HCC) - Lipid panel - Comprehensive metabolic panel Refill: - atorvastatin (LIPITOR) 40 MG tablet; Take 1 tablet (40 mg total) by mouth daily.  Dispense: 30 tablet; Refill: 11  3. Essential hypertension - stable, at goal - Comprehensive metabolic panel - cont current meds  4. GAD (generalized anxiety disorder) - cont lexapro 20mg  daily Rx: - busPIRone (BUSPAR) 7.5 MG tablet; 1 tab daily x 1 week then 1 tab po BID  Dispense: 60 tablet; Refill: 1 - f/u in 3-4 wks or sooner PRN    This visit occurred during the SARS-CoV-2 public health emergency.  Safety protocols were in place, including screening questions prior to the visit, additional usage of staff PPE, and extensive cleaning of exam room while observing appropriate contact time as indicated for disinfecting solutions.

## 2019-06-03 LAB — COMPREHENSIVE METABOLIC PANEL
ALT: 19 U/L (ref 0–35)
AST: 12 U/L (ref 0–37)
Albumin: 3.9 g/dL (ref 3.5–5.2)
Alkaline Phosphatase: 68 U/L (ref 39–117)
BUN: 17 mg/dL (ref 6–23)
CO2: 26 mEq/L (ref 19–32)
Calcium: 10.1 mg/dL (ref 8.4–10.5)
Chloride: 104 mEq/L (ref 96–112)
Creatinine, Ser: 0.98 mg/dL (ref 0.40–1.20)
GFR: 57.34 mL/min — ABNORMAL LOW (ref >60.00)
Glucose, Bld: 171 mg/dL — ABNORMAL HIGH (ref 70–99)
Potassium: 5.1 mEq/L (ref 3.5–5.1)
Sodium: 142 mEq/L (ref 135–145)
Total Bilirubin: 0.2 mg/dL (ref 0.2–1.2)
Total Protein: 6.8 g/dL (ref 6.0–8.3)

## 2019-06-03 LAB — LIPID PANEL
Cholesterol: 234 mg/dL — ABNORMAL HIGH (ref 0–200)
HDL: 49.4 mg/dL (ref >39.00)
Total CHOL/HDL Ratio: 5
Triglycerides: 402 mg/dL — ABNORMAL HIGH (ref 0.0–149.0)

## 2019-06-03 LAB — MICROALBUMIN / CREATININE URINE RATIO
Creatinine,U: 213.4 mg/dL
Microalb Creat Ratio: 0.6 mg/g (ref 0.0–30.0)
Microalb, Ur: 1.2 mg/dL (ref 0.0–1.9)

## 2019-06-03 LAB — LDL CHOLESTEROL, DIRECT: Direct LDL: 133 mg/dL

## 2019-06-03 LAB — HEMOGLOBIN A1C: Hgb A1c MFr Bld: 8.7 % — ABNORMAL HIGH (ref 4.6–6.5)

## 2019-06-16 ENCOUNTER — Telehealth (INDEPENDENT_AMBULATORY_CARE_PROVIDER_SITE_OTHER): Payer: No Typology Code available for payment source | Admitting: Family Medicine

## 2019-06-16 ENCOUNTER — Encounter: Payer: Self-pay | Admitting: Family Medicine

## 2019-06-16 DIAGNOSIS — E785 Hyperlipidemia, unspecified: Secondary | ICD-10-CM

## 2019-06-16 DIAGNOSIS — E119 Type 2 diabetes mellitus without complications: Secondary | ICD-10-CM | POA: Diagnosis not present

## 2019-06-16 DIAGNOSIS — F411 Generalized anxiety disorder: Secondary | ICD-10-CM | POA: Diagnosis not present

## 2019-06-16 DIAGNOSIS — E1169 Type 2 diabetes mellitus with other specified complication: Secondary | ICD-10-CM

## 2019-06-16 NOTE — Progress Notes (Signed)
Virtual Visit via Video Note  I connected with [name] on 06/16/19 at 11:00 AM EDT by a video enabled telemedicine application and verified that I am speaking with the correct person using two identifiers. Location patient: home Location provider: work  Persons participating in the virtual visit: patient, provider  I discussed the limitations of evaluation and management by telemedicine and the availability of in person appointments. The patient expressed understanding and agreed to proceed.  No chief complaint on file.    HPI: [name] is a 64 y.o. female to f/u on anxiety and to discuss lab results, specifically A1C and lipid panel.   Pt feels buspar 7.5mg  qHS has been effective mostly in the past 2 days at manageing her axniety and allowing her to have a more restful night. She would like to continue on this at current dose and frequency. No side effects noted.  Her A1C improved significantly. She is currently taking metformin 1000mg  BID. She would like to commit to a "more diabetic-friendly diet" and see if she can further lower her A1C with this rather than adding another med.   She was not fasting for lipid panel. She had also been off lipitor x 2 wks ahead of labs done 5/5.   Lab Results  Component Value Date   HGBA1C 8.7 (H) 06/02/2019   Lab Results  Component Value Date   CHOL 234 (H) 06/02/2019   HDL 49.40 06/02/2019   LDLDIRECT 133.0 06/02/2019   TRIG (H) 06/02/2019    402.0 Triglyceride is over 400; calculations on Lipids are invalid.   CHOLHDL 5 06/02/2019   Lab Results  Component Value Date   BUN 17 06/02/2019   Lab Results  Component Value Date   CREATININE 0.98 06/02/2019   Lab Results  Component Value Date   ALT 19 06/02/2019   AST 12 06/02/2019   ALKPHOS 68 06/02/2019   BILITOT 0.2 06/02/2019     Past Medical History:  Diagnosis Date  . Anxiety   . Cancer ([place])    endometrial  . Complication of anesthesia    slow to  awaken in past  . Depression   . Hypertension   . Kidney, benign tumor checked every 5 years   right kidney spot  . Urticaria     Past Surgical History:  Procedure Laterality Date  . APPENDECTOMY    . BTL    . HIATAL HERNIA REPAIR  12/1999  . ROBOTIC ASSISTED TOTAL HYSTERECTOMY WITH BILATERAL SALPINGO OOPHERECTOMY Bilateral 06/23/2012   Procedure: ROBOTIC ASSISTED TOTAL HYSTERECTOMY WITH BILATERAL SALPINGO OOPHORECTOMY  LYMPH NODES, POSSIBLE LAPAROTOMY ;  Surgeon: Imagene Gurney A. Alycia Rossetti, MD;  Location: [facility];  Service: Gynecology;  Laterality: Bilateral;  . TUBAL LIGATION      Family History  Problem Relation Age of Onset  . Hypertension Mother   . Diabetes Father   . Cancer Father   . Hypertension Father   . Hypertension Sister   . Cancer Brother   . Hypertension Brother   . Cancer Paternal Aunt   . Cancer Maternal Grandmother   . Cancer Paternal Grandmother     Social History   Tobacco Use  . Smoking status: Never Smoker  . Smokeless tobacco: Never Used  Substance Use Topics  . Alcohol use: Yes    Alcohol/week: 1.0 standard drinks    Types: 1 Glasses of wine per week    Comment: occas  . Drug use: No     Current Outpatient Medications:  .  ALPRAZolam (XANAX) 0.5 MG tablet, Take 0.5 tablets (0.25 mg total) by mouth daily as needed for sleep or anxiety. 1/2 tab to 1 tab, Disp: 30 tablet, Rfl: 3 .  amLODipine-olmesartan (AZOR) 10-40 MG tablet, Take 1 tablet by mouth every morning., Disp: 90 tablet, Rfl: 3 .  atorvastatin (LIPITOR) 40 MG tablet, Take 1 tablet (40 mg total) by mouth daily., Disp: 30 tablet, Rfl: 11 .  busPIRone (BUSPAR) 7.5 MG tablet, 1 tab daily x 1 week then 1 tab po BID, Disp: 60 tablet, Rfl: 1 .  escitalopram (LEXAPRO) 20 MG tablet, Take 1 tablet (20 mg total) by mouth daily., Disp: 90 tablet, Rfl: 3 .  esomeprazole (NEXIUM) 20 MG capsule, Take 20 mg by mouth daily at 12 noon., Disp: , Rfl:  .  famciclovir (FAMVIR) 250 MG tablet, Take 1 tablet (250 mg  total) by mouth 2 (two) times daily., Disp: 180 tablet, Rfl: 3 .  FLAXSEED, LINSEED, PO, With omega 3., Disp: , Rfl:  .  metFORMIN (GLUCOPHAGE) 1000 MG tablet, Take 1 tablet (1,000 mg total) by mouth 2 (two) times daily with a meal. (Patient taking differently: Take 1,000 mg by mouth 2 (two) times daily with a meal. Pt taking 1500mg  daily), Disp: 180 tablet, Rfl: 1 .  metoprolol succinate (TOPROL-XL) 100 MG 24 hr tablet, Take 1 tablet (100 mg total) by mouth every morning. Take with or immediately following a meal., Disp: 90 tablet, Rfl: 3  Allergies  Allergen Reactions  . Cola (Syrup) Anaphylaxis  . Sulfa Antibiotics Hives and Rash      ROS: See pertinent positives and negatives per HPI.   EXAM:  VITALS per patient if applicable: LMP [id number]    GENERAL: alert, oriented, appears well and in no acute distress  NECK: normal movements of the head and neck  LUNGS: no  gross SOB, gasping or wheezing, no conversational dyspnea  CV: no obvious cyanosis  PSYCH/NEURO: pleasant and cooperative,speech and thought processing grossly intact   ASSESSMENT AND PLAN: 1. GAD (generalized anxiety disorder) - improved with addition of buspar 7.5mg  BID (pt is taking daily) and feels this is effective along with lexapro 20mg  daily  2. Type 2 diabetes mellitus without complication, without long-term current use of insulin (HCC) - A1C significantly improved to 8.7 - cont metformin 1000mg  BID - pt would like to commit to a "more diabetic-friendly diet" and see if she can further lower her A1C with this rather than adding another med - recheck A1C in 3 mo, if no/minimal improvement, she is agreeable to adding med  3. Hyperlipidemia associated with type 2 diabetes mellitus (HCC) - LDL not at goal, TG significantly elevated on last lipid panel (pt was not fasting) - cont lipitor 40mg  daily - pt will work on lifestyle modifications and plan to repeat fasting lipid panel in 3 mo     I discussed  the assessment and treatment plan with the patient. The patient was provided an opportunity to ask questions and all were answered. The patient agreed with the plan and demonstrated an understanding of the instructions.   The patient was advised to call back or seek an in-person evaluation if the symptoms worsen or if the condition fails to improve as anticipated.   Letta Median, DO

## 2019-07-29 ENCOUNTER — Other Ambulatory Visit: Payer: Self-pay

## 2019-07-30 ENCOUNTER — Encounter: Payer: Self-pay | Admitting: Family Medicine

## 2019-07-30 ENCOUNTER — Ambulatory Visit: Payer: No Typology Code available for payment source | Admitting: Family Medicine

## 2019-07-30 VITALS — BP 128/82 | HR 99 | Temp 96.6°F | Ht 65.0 in | Wt 215.0 lb

## 2019-07-30 DIAGNOSIS — H6991 Unspecified Eustachian tube disorder, right ear: Secondary | ICD-10-CM | POA: Insufficient documentation

## 2019-07-30 MED ORDER — PREDNISONE 10 MG PO TABS: 10.0000 mg | ORAL_TABLET | Freq: Two times a day (BID) | ORAL | 0 refills | Status: DC

## 2019-07-30 MED ORDER — PREDNISONE 10 MG PO TABS: 10.0000 mg | ORAL_TABLET | Freq: Two times a day (BID) | ORAL | 0 refills | Status: AC

## 2019-07-30 NOTE — Progress Notes (Signed)
Established Patient Office Visit  Subjective:  Patient ID: [name], female    DOB: 04-21-1955  Age: 64 y.o. MRN: [id number]  CC:  Chief Complaint  Patient presents with  . Acute Visit    right ear feels full/clogged up unable to hear symptoms x 3 days.     HPI [name] presents for right ear congestion with muffled hearing.  Denies history of ceruminosis with this year.  Denies postnasal drip.  Denies dizziness, lightheadedness, headache.  She did feel a pop in her jaw yesterday.  Has been doing better since then but still feels congested.  Admits that she probably snores.  Up several times a night with the need to urinate.  Past Medical History:  Diagnosis Date  . Anxiety   . Cancer (HCC)    endometrial  . Complication of anesthesia    slow to awaken in past  . Depression   . Hypertension   . Kidney, benign tumor checked every 5 years   right kidney spot  . Urticaria     Past Surgical History:  Procedure Laterality Date  . APPENDECTOMY    . BTL    . HIATAL HERNIA REPAIR  12/1999  . ROBOTIC ASSISTED TOTAL HYSTERECTOMY WITH BILATERAL SALPINGO OOPHERECTOMY Bilateral 06/23/2012   Procedure: ROBOTIC ASSISTED TOTAL HYSTERECTOMY WITH BILATERAL SALPINGO OOPHORECTOMY  LYMPH NODES, POSSIBLE LAPAROTOMY ;  Surgeon: Rejeana Brock A. Duard Brady, MD;  Location: [facility];  Service: Gynecology;  Laterality: Bilateral;  . TUBAL LIGATION      Family History  Problem Relation Age of Onset  . Hypertension Mother   . Diabetes Father   . Cancer Father   . Hypertension Father   . Hypertension Sister   . Cancer Brother   . Hypertension Brother   . Cancer Paternal Aunt   . Cancer Maternal Grandmother   . Cancer Paternal Grandmother     Social History   Socioeconomic History  . Marital status: Widowed    Spouse name: Not on file  . Number of children: Not on file  . Years of education: Not on file  . Highest education level: Not on file  Occupational History  . Not on  file  Tobacco Use  . Smoking status: Never Smoker  . Smokeless tobacco: Never Used  Vaping Use  . Vaping Use: Never used  Substance and Sexual Activity  . Alcohol use: Yes    Alcohol/week: 1.0 standard drink    Types: 1 Glasses of wine per week    Comment: occas  . Drug use: No  . Sexual activity: Never  Other Topics Concern  . Not on file  Social History Narrative  . Not on file   Social Determinants of Health   Financial Resource Strain:   . Difficulty of Paying Living Expenses:   Food Insecurity:   . Worried About Programme researcher, broadcasting/film/video in the Last Year:   . Barista in the Last Year:   Transportation Needs:   . Freight forwarder (Medical):   Marland Kitchen Lack of Transportation (Non-Medical):   Physical Activity:   . Days of Exercise per Week:   . Minutes of Exercise per Session:   Stress:   . Feeling of Stress :   Social Connections:   . Frequency of Communication with Friends and Family:   . Frequency of Social Gatherings with Friends and Family:   . Attends Religious Services:   . Active Member of Clubs or Organizations:   .  Attends Banker Meetings:   Marland Kitchen Marital Status:   Intimate Partner Violence:   . Fear of Current or Ex-Partner:   . Emotionally Abused:   Marland Kitchen Physically Abused:   . Sexually Abused:     Outpatient Medications Prior to Visit  Medication Sig Dispense Refill  . ALPRAZolam (XANAX) 0.5 MG tablet Take 0.5 tablets (0.25 mg total) by mouth daily as needed for sleep or anxiety. 1/2 tab to 1 tab 30 tablet 3  . amLODipine-olmesartan (AZOR) 10-40 MG tablet Take 1 tablet by mouth every morning. 90 tablet 3  . atorvastatin (LIPITOR) 40 MG tablet Take 1 tablet (40 mg total) by mouth daily. 30 tablet 11  . busPIRone (BUSPAR) 7.5 MG tablet 1 tab daily x 1 week then 1 tab po BID 60 tablet 1  . escitalopram (LEXAPRO) 20 MG tablet Take 1 tablet (20 mg total) by mouth daily. 90 tablet 3  . esomeprazole (NEXIUM) 20 MG capsule Take 20 mg by mouth daily  at 12 noon.    . famciclovir (FAMVIR) 250 MG tablet Take 1 tablet (250 mg total) by mouth 2 (two) times daily. 180 tablet 3  . FLAXSEED, LINSEED, PO With omega 3.    . metoprolol succinate (TOPROL-XL) 100 MG 24 hr tablet Take 1 tablet (100 mg total) by mouth every morning. Take with or immediately following a meal. 90 tablet 3  . metFORMIN (GLUCOPHAGE) 1000 MG tablet Take 1 tablet (1,000 mg total) by mouth 2 (two) times daily with a meal. (Patient taking differently: Take 1,000 mg by mouth 2 (two) times daily with a meal. Pt taking 1500mg  daily) 180 tablet 1   No facility-administered medications prior to visit.    Allergies  Allergen Reactions  . Cola (Syrup) Anaphylaxis  . Sulfa Antibiotics Hives and Rash    ROS Review of Systems  Constitutional: Negative for diaphoresis, fatigue, fever and unexpected weight change.  HENT: Positive for hearing loss. Negative for congestion, ear discharge, ear pain, postnasal drip, rhinorrhea, sinus pressure and sinus pain.   Eyes: Negative for photophobia.  Respiratory: Negative.   Cardiovascular: Negative.   Gastrointestinal: Negative.   Skin: Negative for pallor and rash.  Neurological: Negative for dizziness, light-headedness and headaches.      Objective:    Physical Exam Vitals and nursing note reviewed.  Constitutional:      General: She is not in acute distress.    Appearance: Normal appearance. She is not ill-appearing, toxic-appearing or diaphoretic.  HENT:     Head: Normocephalic and atraumatic.     Right Ear: There is no impacted cerumen. Tympanic membrane is retracted.     Left Ear: Ear canal and external ear normal. There is no impacted cerumen. Tympanic membrane is retracted.     Ears:      Nose: Nose normal.     Mouth/Throat:     Mouth: Mucous membranes are moist.     Pharynx: Oropharynx is clear. No oropharyngeal exudate.   Eyes:     General: No scleral icterus.       Right eye: No discharge.        Left eye: No  discharge.     Extraocular Movements: Extraocular movements intact.     Conjunctiva/sclera: Conjunctivae normal.     Pupils: Pupils are equal, round, and reactive to light.  Cardiovascular:     Rate and Rhythm: Normal rate and regular rhythm.  Pulmonary:     Effort: Pulmonary effort is normal.  Breath sounds: Normal breath sounds.  Musculoskeletal:     Cervical back: No rigidity.  Lymphadenopathy:     Cervical: No cervical adenopathy.  Skin:    General: Skin is warm and dry.  Neurological:     Mental Status: She is alert and oriented to person, place, and time.  Psychiatric:        Mood and Affect: Mood normal.        Behavior: Behavior normal.     BP 128/82   Pulse 99   Temp (!) 96.6 F (35.9 C) (Tympanic)   Ht 5\' 5"  (1.651 m)   Wt 215 lb (97.5 kg)   LMP 05/08/2012   SpO2 94%   BMI 35.78 kg/m  Wt Readings from Last 3 Encounters:  07/30/19 215 lb (97.5 kg)  06/02/19 214 lb 12.8 oz (97.4 kg)  10/15/18 210 lb 12.8 oz (95.6 kg)     Health Maintenance Due  Topic Date Due  . FOOT EXAM  Never done  . OPHTHALMOLOGY EXAM  Never done  . COVID-19 Vaccine (1) Never done  . HIV Screening  Never done  . TETANUS/TDAP  Never done    There are no preventive care reminders to display for this patient.  Lab Results  Component Value Date   TSH 1.15 10/07/2018   Lab Results  Component Value Date   WBC 7.8 10/07/2018   HGB 15.7 (H) 10/07/2018   HCT 46.1 (H) 10/07/2018   MCV 91.1 10/07/2018   PLT 338.0 10/07/2018   Lab Results  Component Value Date   NA 142 06/02/2019   K 5.1 06/02/2019   CO2 26 06/02/2019   GLUCOSE 171 (H) 06/02/2019   BUN 17 06/02/2019   CREATININE 0.98 06/02/2019   BILITOT 0.2 06/02/2019   ALKPHOS 68 06/02/2019   AST 12 06/02/2019   ALT 19 06/02/2019   PROT 6.8 06/02/2019   ALBUMIN 3.9 06/02/2019   CALCIUM 10.1 06/02/2019   ANIONGAP 12 11/07/2017   GFR 57.34 (L) 06/02/2019   Lab Results  Component Value Date   CHOL 234 (H) 06/02/2019    Lab Results  Component Value Date   HDL 49.40 06/02/2019   No results found for: [facility] Lab Results  Component Value Date   TRIG (H) 06/02/2019    402.0 Triglyceride is over 400; calculations on Lipids are invalid.   Lab Results  Component Value Date   CHOLHDL 5 06/02/2019   Lab Results  Component Value Date   HGBA1C 8.7 (H) 06/02/2019      Assessment & Plan:   Problem List Items Addressed This Visit      Nervous and Auditory   Eustachian tube disorder, right - Primary   Relevant Medications   predniSONE (DELTASONE) 10 MG tablet      Meds ordered this encounter  Medications  . DISCONTD: predniSONE (DELTASONE) 10 MG tablet    Sig: Take 1 tablet (10 mg total) by mouth 2 (two) times daily with a meal for 7 days.    Dispense:  14 tablet    Refill:  0  . predniSONE (DELTASONE) 10 MG tablet    Sig: Take 1 tablet (10 mg total) by mouth 2 (two) times daily with a meal for 7 days.    Dispense:  14 tablet    Refill:  0    Allow to hold.    Follow-up: Return if symptoms worsen or fail to improve.  Feels as though it is improving on its own.  Asked pharmacy  to hold prednisone.  Discuss possibility of apnea with Dr. Jarome Matin, MD

## 2019-07-30 NOTE — Patient Instructions (Signed)
Eustachian Tube Dysfunction ° °Eustachian tube dysfunction refers to a condition in which a blockage develops in the narrow passage that connects the middle ear to the back of the nose (eustachian tube). The eustachian tube regulates air pressure in the middle ear by letting air move between the ear and nose. It also helps to drain fluid from the middle ear space. °Eustachian tube dysfunction can affect one or both ears. When the eustachian tube does not function properly, air pressure, fluid, or both can build up in the middle ear. °What are the causes? °This condition occurs when the eustachian tube becomes blocked or cannot open normally. Common causes of this condition include: °· Ear infections. °· Colds and other infections that affect the nose, mouth, and throat (upper respiratory tract). °· Allergies. °· Irritation from cigarette smoke. °· Irritation from stomach acid coming up into the esophagus (gastroesophageal reflux). The esophagus is the tube that carries food from the mouth to the stomach. °· Sudden changes in air pressure, such as from descending in an airplane or scuba diving. °· Abnormal growths in the nose or throat, such as: °? Growths that line the nose (nasal polyps). °? Abnormal growth of cells (tumors). °? Enlarged tissue at the back of the throat (adenoids). °What increases the risk? °You are more likely to develop this condition if: °· You smoke. °· You are overweight. °· You are a child who has: °? Certain birth defects of the mouth, such as cleft palate. °? Large tonsils or adenoids. °What are the signs or symptoms? °Common symptoms of this condition include: °· A feeling of fullness in the ear. °· Ear pain. °· Clicking or popping noises in the ear. °· Ringing in the ear. °· Hearing loss. °· Loss of balance. °· Dizziness. °Symptoms may get worse when the air pressure around you changes, such as when you travel to an area of high elevation, fly on an airplane, or go scuba diving. °How is  this diagnosed? °This condition may be diagnosed based on: °· Your symptoms. °· A physical exam of your ears, nose, and throat. °· Tests, such as those that measure: °? The movement of your eardrum (tympanogram). °? Your hearing (audiometry). °How is this treated? °Treatment depends on the cause and severity of your condition. °· In mild cases, you may relieve your symptoms by moving air into your ears. This is called "popping the ears." °· In more severe cases, or if you have symptoms of fluid in your ears, treatment may include: °? Medicines to relieve congestion (decongestants). °? Medicines that treat allergies (antihistamines). °? Nasal sprays or ear drops that contain medicines that reduce swelling (steroids). °? A procedure to drain the fluid in your eardrum (myringotomy). In this procedure, a small tube is placed in the eardrum to: °§ Drain the fluid. °§ Restore the air in the middle ear space. °? A procedure to insert a balloon device through the nose to inflate the opening of the eustachian tube (balloon dilation). °Follow these instructions at home: °Lifestyle °· Do not do any of the following until your health care provider approves: °? Travel to high altitudes. °? Fly in airplanes. °? Work in a pressurized cabin or room. °? Scuba dive. °· Do not use any products that contain nicotine or tobacco, such as cigarettes and e-cigarettes. If you need help quitting, ask your health care provider. °· Keep your ears dry. Wear fitted earplugs during showering and bathing. Dry your ears completely after. °General instructions °· Take over-the-counter   and prescription medicines only as told by your health care provider. °· Use techniques to help pop your ears as recommended by your health care provider. These may include: °? Chewing gum. °? Yawning. °? Frequent, forceful swallowing. °? Closing your mouth, holding your nose closed, and gently blowing as if you are trying to blow air out of your nose. °· Keep all  follow-up visits as told by your health care provider. This is important. °Contact a health care provider if: °· Your symptoms do not go away after treatment. °· Your symptoms come back after treatment. °· You are unable to pop your ears. °· You have: °? A fever. °? Pain in your ear. °? Pain in your head or neck. °? Fluid draining from your ear. °· Your hearing suddenly changes. °· You become very dizzy. °· You lose your balance. °Summary °· Eustachian tube dysfunction refers to a condition in which a blockage develops in the eustachian tube. °· It can be caused by ear infections, allergies, inhaled irritants, or abnormal growths in the nose or throat. °· Symptoms include ear pain, hearing loss, or ringing in the ears. °· Mild cases are treated with maneuvers to unblock the ears, such as yawning or ear popping. °· Severe cases are treated with medicines. Surgery may also be done (rare). °This information is not intended to replace advice given to you by your health care provider. Make sure you discuss any questions you have with your health care provider. °Document Revised: 05/06/2017 Document Reviewed: 05/06/2017 °Elsevier Patient Education © 2020 [employer] ° °

## 2019-08-05 ENCOUNTER — Other Ambulatory Visit: Payer: Self-pay | Admitting: Family Medicine

## 2019-08-25 ENCOUNTER — Telehealth: Payer: Self-pay | Admitting: Family Medicine

## 2019-08-25 NOTE — Telephone Encounter (Signed)
Ok for pt to receive?

## 2019-08-25 NOTE — Telephone Encounter (Signed)
Patient is calling and wanted to see if Dr. Loletha Grayer can put in an order to get the TDap vaccine. Patient stated that she needed vaccine because she is going to around a new born baby please advise. CB is [phone]

## 2019-08-25 NOTE — Telephone Encounter (Signed)
Ok, for pt to have Tdap.  Please schedule a nurse visit.  Thank you.

## 2019-08-25 NOTE — Telephone Encounter (Signed)
Called patient to schedule, no answer. Left message to give the office a call back.

## 2019-08-25 NOTE — Telephone Encounter (Signed)
Yes ok to received Tdap vaccine

## 2019-08-25 NOTE — Telephone Encounter (Signed)
Patient sent MyChart message requesting an appointment to update TDap vaccine. Left message to give the office a call back to schedule.

## 2019-08-27 ENCOUNTER — Other Ambulatory Visit: Payer: Self-pay | Admitting: Family Medicine

## 2019-08-27 DIAGNOSIS — F411 Generalized anxiety disorder: Secondary | ICD-10-CM

## 2019-08-30 NOTE — Patient Instructions (Signed)
Health Maintenance Due  Topic Date Due  . FOOT EXAM  Never done  . OPHTHALMOLOGY EXAM  Never done  . COVID-19 Vaccine (1) Never done  . HIV Screening  Never done  . TETANUS/TDAP  Never done  . INFLUENZA VACCINE  08/29/2019    No flowsheet data found.

## 2019-08-30 NOTE — Telephone Encounter (Signed)
Last OV w/Kremer 07/30/19 Last fill 06/02/19  #60/1

## 2019-08-31 ENCOUNTER — Ambulatory Visit (INDEPENDENT_AMBULATORY_CARE_PROVIDER_SITE_OTHER): Payer: No Typology Code available for payment source

## 2019-08-31 ENCOUNTER — Other Ambulatory Visit: Payer: Self-pay

## 2019-08-31 DIAGNOSIS — Z23 Encounter for immunization: Secondary | ICD-10-CM

## 2019-09-07 NOTE — Progress Notes (Signed)
Medical screening examination/treatment/procedure(s) were performed by the CMA. As primary care provider I was immediately available for consulation/collaboration. I agree with above documentation. Warwick Nick, AGNP-C 

## 2019-10-08 ENCOUNTER — Other Ambulatory Visit: Payer: Self-pay | Admitting: Family Medicine

## 2019-10-18 ENCOUNTER — Other Ambulatory Visit: Payer: Self-pay | Admitting: Family Medicine

## 2019-11-03 ENCOUNTER — Encounter: Payer: Self-pay | Admitting: Family Medicine

## 2019-11-03 MED ORDER — FAMCICLOVIR 250 MG PO TABS: 250.0000 mg | ORAL_TABLET | Freq: Two times a day (BID) | ORAL | 3 refills | Status: DC

## 2019-11-04 ENCOUNTER — Other Ambulatory Visit: Payer: Self-pay | Admitting: Family Medicine

## 2019-11-05 NOTE — Telephone Encounter (Signed)
Forwarding to PCP for review.  

## 2019-11-09 IMAGING — CT CT HEAD W/O CM
3 series · 14 of 47 positions shown, 16 images · non-contrast
Comparison: None.

CLINICAL DATA: Dizziness and unsteady gait this morning.

EXAM:
CT HEAD WITHOUT CONTRAST
TECHNIQUE: Contiguous axial images were obtained from the base of the skull
through the vertex without intravenous contrast.

[Series 2: head wo · axial · 0.43mm/px · z∈[-199,-69]mm · 8 of 32 slices shown, 10 images]
[im 3/32  brain]
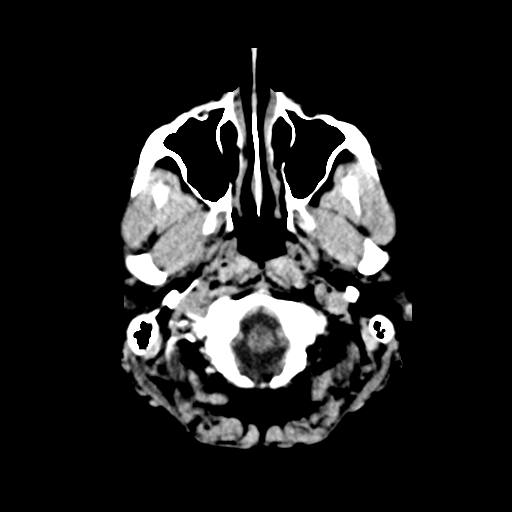
[im 3/32  bone]
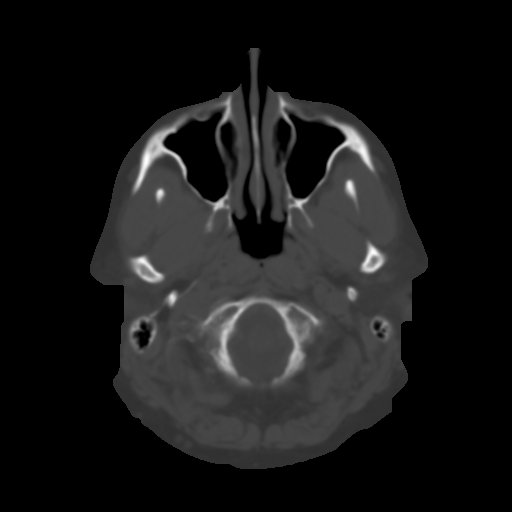
[im 7/32  brain]
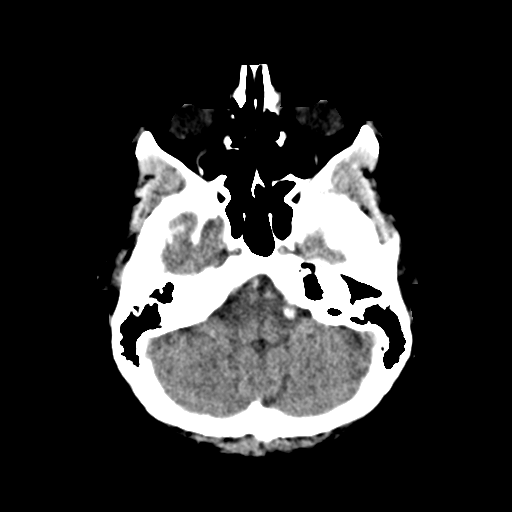
[im 10/32  brain]
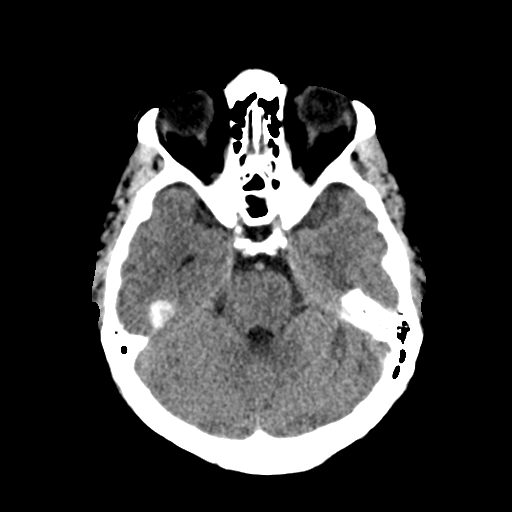
[im 14/32  brain]
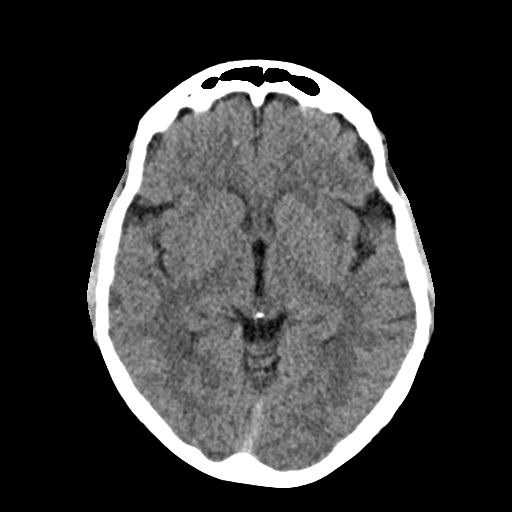
[im 18/32  brain]
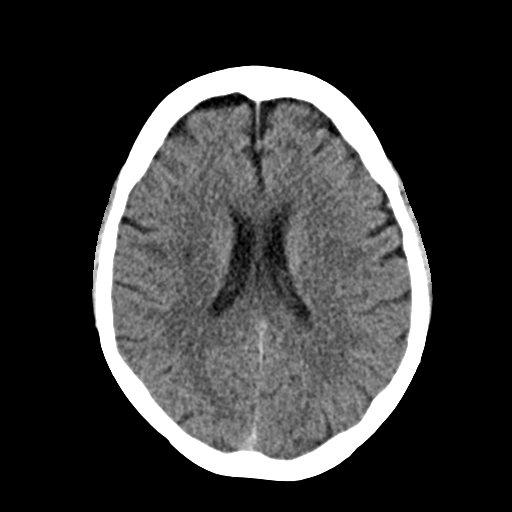
[im 18/32  bone]
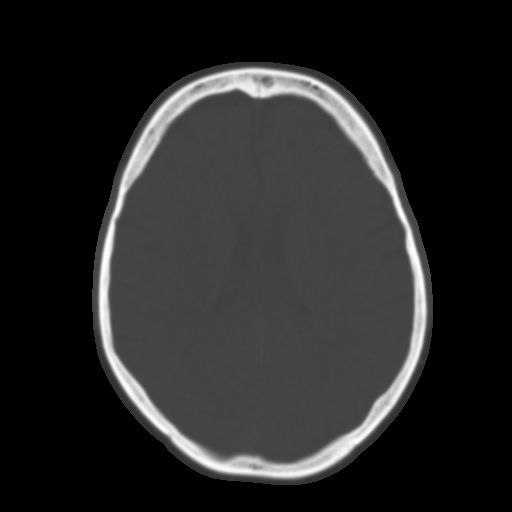
[im 22/32  brain]
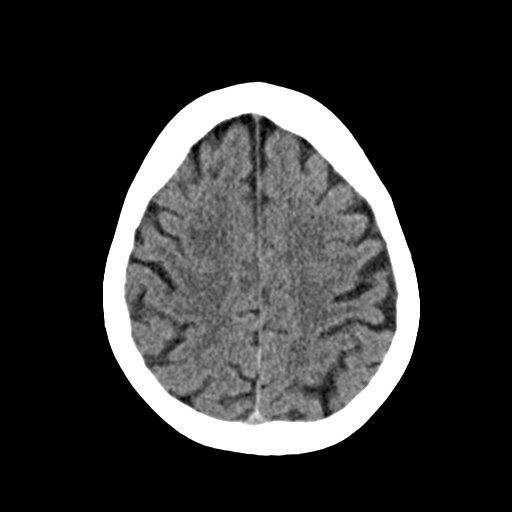
[im 25/32  brain]
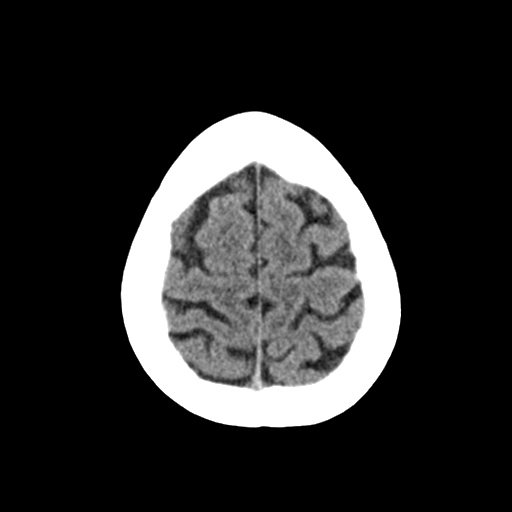
[im 29/32  brain]
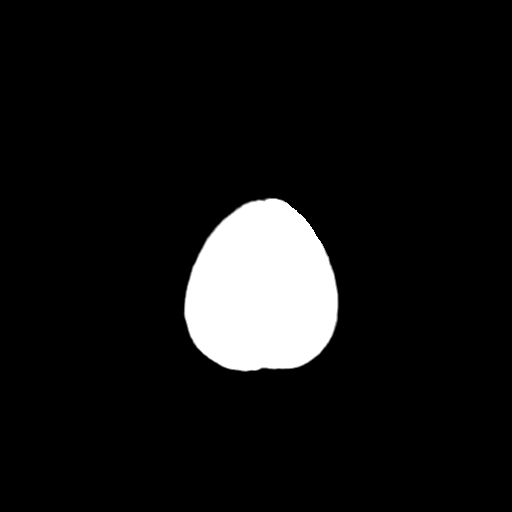

[Series 4: coronal soft · coronal · 0.30mm/px · 3 of 61 slices shown]
[im 21/61  brain]
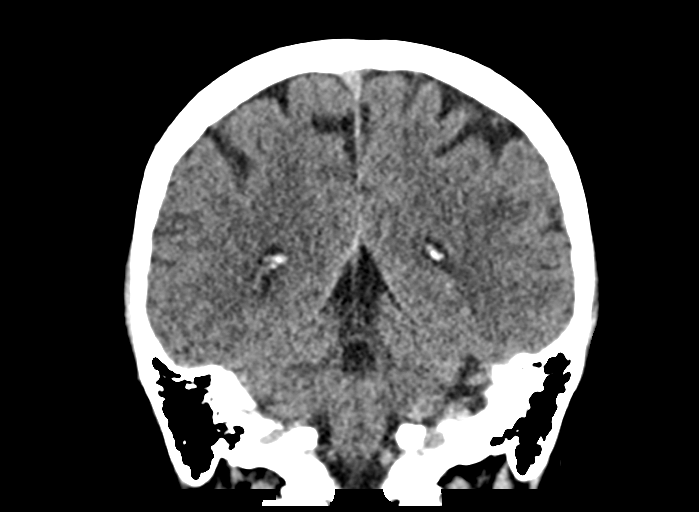
[im 27/61  brain]
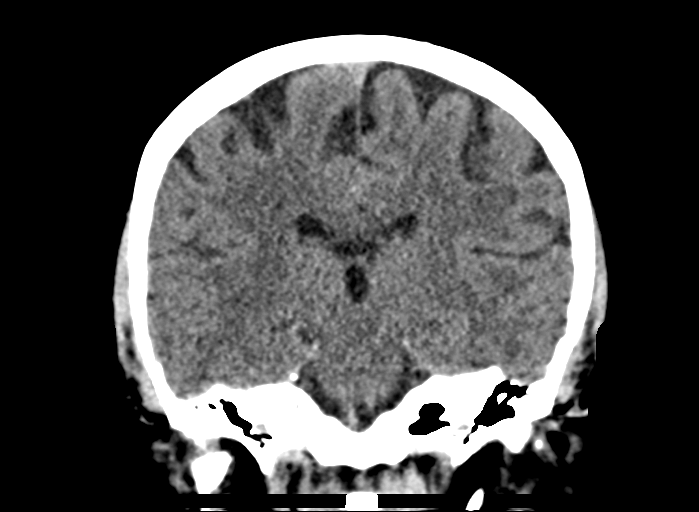
[im 34/61  brain]
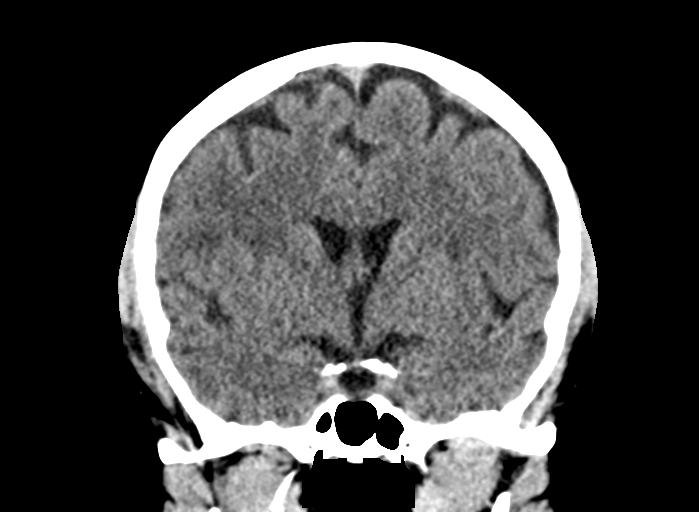

[Series 5: sag soft · sagittal · 0.29mm/px · 3 of 58 slices shown]
[im 20/58  brain]
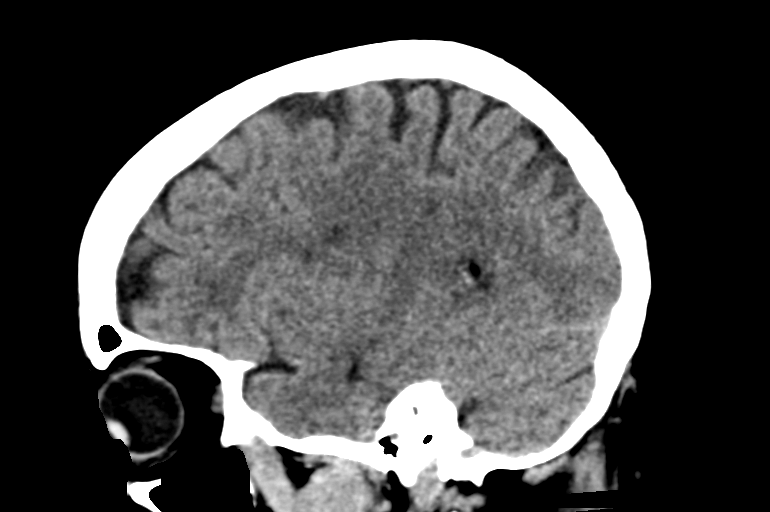
[im 29/58  brain]
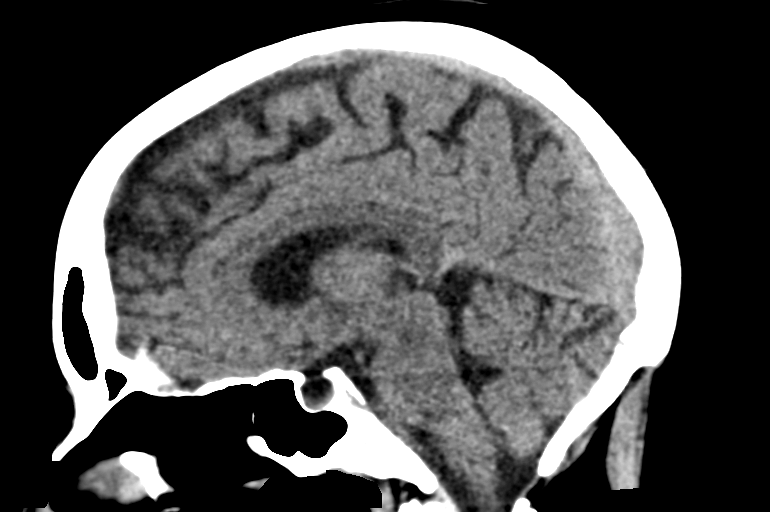
[im 39/58  brain]
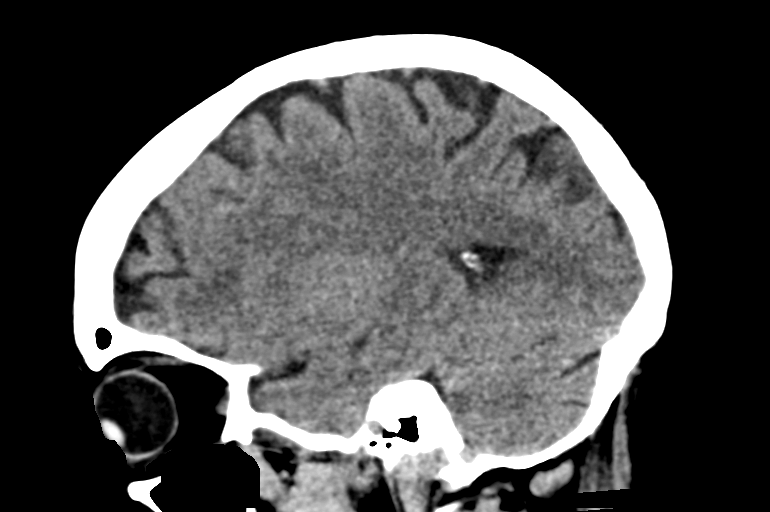

[14 of 47 positions shown; findings below may reference images not displayed]

FINDINGS: Brain: Ventricles are within normal limits in size and
configuration. Mild chronic small vessel ischemic change noted
within the periventricular white matter regions bilaterally.

No mass, hemorrhage, edema or other evidence of acute parenchymal
abnormality. No extra-axial hemorrhage.

Vascular: Chronic calcified atherosclerotic changes of the large
vessels at the skull base. No unexpected hyperdense vessel.

Skull: Normal. Negative for fracture or focal lesion.

Sinuses/Orbits: No acute finding.

Other: None.
IMPRESSION: 1. No acute findings.  No intracranial mass, hemorrhage or edema.
2. Mild chronic small vessel ischemic change within the white
matter.

## 2019-12-06 ENCOUNTER — Encounter: Payer: Self-pay | Admitting: Family Medicine

## 2019-12-08 ENCOUNTER — Telehealth: Payer: No Typology Code available for payment source | Admitting: Family Medicine

## 2019-12-08 ENCOUNTER — Telehealth: Payer: Self-pay

## 2019-12-08 MED ORDER — AMLODIPINE-OLMESARTAN 10-40 MG PO TABS: 1.0000 | ORAL_TABLET | Freq: Every morning | ORAL | 0 refills | Status: DC

## 2019-12-08 NOTE — Telephone Encounter (Signed)
Pt called to get refill on Bp medication.

## 2019-12-16 ENCOUNTER — Encounter: Payer: Self-pay | Admitting: Family Medicine

## 2019-12-16 ENCOUNTER — Telehealth (INDEPENDENT_AMBULATORY_CARE_PROVIDER_SITE_OTHER): Payer: No Typology Code available for payment source | Admitting: Family Medicine

## 2019-12-16 VITALS — Temp 98.7°F | Ht 65.0 in | Wt 220.0 lb

## 2019-12-16 DIAGNOSIS — F411 Generalized anxiety disorder: Secondary | ICD-10-CM | POA: Diagnosis not present

## 2019-12-16 MED ORDER — ALPRAZOLAM 0.5 MG PO TABS: 0.2500 mg | ORAL_TABLET | Freq: Every day | ORAL | 2 refills | Status: DC | PRN

## 2019-12-16 NOTE — Progress Notes (Signed)
Virtual Visit via Video Note  I connected with [name] on 12/16/19 at  3:00 PM EST by a video enabled telemedicine application and verified that I am speaking with the correct person using two identifiers. Location patient: home Location provider: work  Persons participating in the virtual visit: patient, provider  I discussed the limitations of evaluation and management by telemedicine and the availability of in person appointments. The patient expressed understanding and agreed to proceed.  Chief Complaint  Patient presents with  . Follow-up    f/u med/refills on Alprazolam     HPI: [name] is a 64 y.o. female seen today for f/u on anxiety and medication refill - xanax 0.5mg  tabs - pt takes 1/2 tab 0.25mg  daily and rarely BID. She did not tolerate buspirone and is not taking. Pt is taking lexapro 20mg  daily.  Mother had a stoke 1 mo ago, understandably pt has been under a lot of stress.   Past Medical History:  Diagnosis Date  . Anxiety   . Cancer ([place])    endometrial  . Complication of anesthesia    slow to awaken in past  . Depression   . Hypertension   . Kidney, benign tumor checked every 5 years   right kidney spot  . Urticaria     Past Surgical History:  Procedure Laterality Date  . APPENDECTOMY    . BTL    . HIATAL HERNIA REPAIR  12/1999  . ROBOTIC ASSISTED TOTAL HYSTERECTOMY WITH BILATERAL SALPINGO OOPHERECTOMY Bilateral 06/23/2012   Procedure: ROBOTIC ASSISTED TOTAL HYSTERECTOMY WITH BILATERAL SALPINGO OOPHORECTOMY  LYMPH NODES, POSSIBLE LAPAROTOMY ;  Surgeon: Imagene Gurney A. Alycia Rossetti, MD;  Location: [facility];  Service: Gynecology;  Laterality: Bilateral;  . TUBAL LIGATION      Family History  Problem Relation Age of Onset  . Hypertension Mother   . Diabetes Father   . Cancer Father   . Hypertension Father   . Hypertension Sister   . Cancer Brother   . Hypertension Brother   . Cancer Paternal Aunt   . Cancer Maternal Grandmother   . Cancer  Paternal Grandmother     Social History   Tobacco Use  . Smoking status: Never Smoker  . Smokeless tobacco: Never Used  Vaping Use  . Vaping Use: Never used  Substance Use Topics  . Alcohol use: Yes    Alcohol/week: 1.0 standard drink    Types: 1 Glasses of wine per week    Comment: occas  . Drug use: No     Current Outpatient Medications:  .  ALPRAZolam (XANAX) 0.5 MG tablet, Take 0.5 tablets (0.25 mg total) by mouth daily as needed for sleep or anxiety. 1/2 tab to 1 tab, Disp: 30 tablet, Rfl: 2 .  amLODipine-olmesartan (AZOR) 10-40 MG tablet, Take 1 tablet by mouth every morning., Disp: 90 tablet, Rfl: 0 .  atorvastatin (LIPITOR) 40 MG tablet, Take 1 tablet (40 mg total) by mouth daily., Disp: 30 tablet, Rfl: 11 .  escitalopram (LEXAPRO) 20 MG tablet, TAKE 1 TABLET(20 MG) BY MOUTH DAILY, Disp: 90 tablet, Rfl: 3 .  esomeprazole (NEXIUM) 20 MG capsule, Take 20 mg by mouth daily at 12 noon., Disp: , Rfl:  .  famciclovir (FAMVIR) 250 MG tablet, Take 1 tablet (250 mg total) by mouth 2 (two) times daily., Disp: 180 tablet, Rfl: 3 .  FLAXSEED, LINSEED, PO, With omega 3., Disp: , Rfl:  .  metFORMIN (GLUCOPHAGE) 1000 MG tablet, TAKE 1 TABLET(1000 MG) BY MOUTH  TWICE DAILY WITH A MEAL, Disp: 180 tablet, Rfl: 3 .  metoprolol succinate (TOPROL-XL) 100 MG 24 hr tablet, TAKE 1 TABLET BY MOUTH EVERY MORNING. TAKE WITH OR IMMEDIATELY FOLLOWING A MEAL, Disp: 90 tablet, Rfl: 3  Allergies  Allergen Reactions  . Cola (Syrup) Anaphylaxis  . Sulfa Antibiotics Hives and Rash      ROS: See pertinent positives and negatives per HPI.   EXAM:  VITALS per patient if applicable: Temp 29.5 F (37.1 C) (Temporal)   Ht 5\' 5"  (1.651 m)   Wt 220 lb (99.8 kg) Comment: pt reported  LMP 05/08/2012   BMI 36.61 kg/m    GENERAL: alert, oriented, appears well and in no acute distress  HEENT: atraumatic, conjunctiva clear, no obvious abnormalities on inspection of external nose and ears  NECK: normal  movements of the head and neck  LUNGS: on inspection no signs of respiratory distress, breathing rate appears normal, no obvious gross SOB, gasping or wheezing, no conversational dyspnea  CV: no obvious cyanosis  PSYCH/NEURO: pleasant and cooperative, no obvious depression or anxiety, speech and thought processing grossly intact   ASSESSMENT AND PLAN: 1. GAD (generalized anxiety disorder) - overall well controlled - recent stress with mother's CVA Refill: - ALPRAZolam (XANAX) 0.5 MG tablet; Take 0.5 tablets (0.25 mg total) by mouth daily as needed for sleep or anxiety. 1/2 tab to 1 tab  Dispense: 30 tablet; Refill: 2 - cont lexapro 20mg  daily - database reviewed and appropriate - needs UDS and controlled substance agreement at next in-person appt in 6 mo  - f/u in 6 mo or sooner PRN Discussed plan and reviewed medications with patient, including risks, benefits, and potential side effects. Pt expressed understand. All questions answered.     I discussed the assessment and treatment plan with the patient. The patient was provided an opportunity to ask questions and all were answered. The patient agreed with the plan and demonstrated an understanding of the instructions.   The patient was advised to call back or seek an in-person evaluation if the symptoms worsen or if the condition fails to improve as anticipated.   Letta Median, DO

## 2020-01-24 ENCOUNTER — Other Ambulatory Visit (HOSPITAL_BASED_OUTPATIENT_CLINIC_OR_DEPARTMENT_OTHER): Payer: Self-pay | Admitting: Internal Medicine

## 2020-01-24 ENCOUNTER — Ambulatory Visit: Payer: No Typology Code available for payment source | Attending: Internal Medicine

## 2020-01-24 DIAGNOSIS — Z23 Encounter for immunization: Secondary | ICD-10-CM

## 2020-01-24 MED FILL — JANSSEN COVID-19 VACCINE 0.: 0.5 | 1 days supply | Qty: 1 | Fill #0

## 2020-01-24 NOTE — Progress Notes (Signed)
Covid-19 Vaccination Clinic  Name:  Zaynab Atienza    MRN: [id number] DOB: 06-23-1955  01/24/2020  Ms. Sobieski was observed post Covid-19 immunization for 15 minutes without incident. She was provided with Vaccine Information Sheet and instruction to access the V-Safe system.   Ms. Koeneman was instructed to call 911 with any severe reactions post vaccine: Marland Kitchen Difficulty breathing  . Swelling of face and throat  . A fast heartbeat  . A bad rash all over body  . Dizziness and weakness   Immunizations Administered    Name Date Dose VIS Date Route   JANSSEN COVID-19 VACCINE 01/24/2020 12:54 PM 0.5 mL 11/17/2019 Intramuscular   Manufacturer: [employer]   Lot: [id number]   NDC: [phone]

## 2020-03-06 ENCOUNTER — Other Ambulatory Visit: Payer: Self-pay | Admitting: Family Medicine

## 2020-03-06 NOTE — Telephone Encounter (Signed)
Last fill 12/08/19 #90/0 Last VV 12/16/19

## 2020-05-22 ENCOUNTER — Other Ambulatory Visit: Payer: Self-pay | Admitting: Family Medicine

## 2020-06-09 ENCOUNTER — Encounter: Payer: No Typology Code available for payment source | Admitting: Family Medicine

## 2020-06-21 ENCOUNTER — Encounter: Payer: No Typology Code available for payment source | Admitting: Family Medicine

## 2020-06-21 ENCOUNTER — Other Ambulatory Visit: Payer: Self-pay | Admitting: Family Medicine

## 2020-06-21 DIAGNOSIS — E1169 Type 2 diabetes mellitus with other specified complication: Secondary | ICD-10-CM

## 2020-06-21 DIAGNOSIS — E785 Hyperlipidemia, unspecified: Secondary | ICD-10-CM

## 2020-07-15 ENCOUNTER — Other Ambulatory Visit: Payer: Self-pay | Admitting: Family Medicine

## 2020-07-27 ENCOUNTER — Other Ambulatory Visit: Payer: Self-pay | Admitting: Family Medicine

## 2020-08-23 ENCOUNTER — Other Ambulatory Visit: Payer: Self-pay

## 2020-08-23 ENCOUNTER — Encounter: Payer: Self-pay | Admitting: Family Medicine

## 2020-08-23 ENCOUNTER — Ambulatory Visit (INDEPENDENT_AMBULATORY_CARE_PROVIDER_SITE_OTHER): Payer: No Typology Code available for payment source | Admitting: Family Medicine

## 2020-08-23 VITALS — BP 126/72 | HR 82 | Temp 97.1°F | Ht 64.0 in

## 2020-08-23 DIAGNOSIS — E1169 Type 2 diabetes mellitus with other specified complication: Secondary | ICD-10-CM | POA: Diagnosis not present

## 2020-08-23 DIAGNOSIS — F411 Generalized anxiety disorder: Secondary | ICD-10-CM

## 2020-08-23 DIAGNOSIS — E785 Hyperlipidemia, unspecified: Secondary | ICD-10-CM

## 2020-08-23 DIAGNOSIS — E119 Type 2 diabetes mellitus without complications: Secondary | ICD-10-CM

## 2020-08-23 DIAGNOSIS — I10 Essential (primary) hypertension: Secondary | ICD-10-CM

## 2020-08-23 DIAGNOSIS — Z0000 Encounter for general adult medical examination without abnormal findings: Secondary | ICD-10-CM | POA: Diagnosis not present

## 2020-08-23 DIAGNOSIS — E559 Vitamin D deficiency, unspecified: Secondary | ICD-10-CM | POA: Diagnosis not present

## 2020-08-23 DIAGNOSIS — Z2821 Immunization not carried out because of patient refusal: Secondary | ICD-10-CM

## 2020-08-23 DIAGNOSIS — Z1231 Encounter for screening mammogram for malignant neoplasm of breast: Secondary | ICD-10-CM

## 2020-08-23 DIAGNOSIS — A6000 Herpesviral infection of urogenital system, unspecified: Secondary | ICD-10-CM

## 2020-08-23 LAB — CBC
HCT: 43.1 % (ref 36.0–46.0)
Hemoglobin: 14.3 g/dL (ref 12.0–15.0)
MCHC: 33.1 g/dL (ref 30.0–36.0)
MCV: 92.7 fl (ref 78.0–100.0)
Platelets: 366 10*3/uL (ref 150.0–400.0)
RBC: 4.65 Mil/uL (ref 3.87–5.11)
RDW: 13.1 % (ref 11.5–15.5)
WBC: 8.2 10*3/uL (ref 4.0–10.5)

## 2020-08-23 LAB — LIPID PANEL
Cholesterol: 167 mg/dL (ref 0–200)
HDL: 59.1 mg/dL (ref >39.00)
LDL Cholesterol: 88 mg/dL (ref 0–99)
NonHDL: 107.62
Total CHOL/HDL Ratio: 3
Triglycerides: 96 mg/dL (ref 0.0–149.0)
VLDL: 19.2 mg/dL (ref 0.0–40.0)

## 2020-08-23 LAB — COMPREHENSIVE METABOLIC PANEL
ALT: 16 U/L (ref 0–35)
AST: 9 U/L (ref 0–37)
Albumin: 3.9 g/dL (ref 3.5–5.2)
Alkaline Phosphatase: 76 U/L (ref 39–117)
BUN: 12 mg/dL (ref 6–23)
CO2: 29 mEq/L (ref 19–32)
Calcium: 10 mg/dL (ref 8.4–10.5)
Chloride: 100 mEq/L (ref 96–112)
Creatinine, Ser: 0.85 mg/dL (ref 0.40–1.20)
GFR: 72.55 mL/min (ref >60.00)
Glucose, Bld: 221 mg/dL — ABNORMAL HIGH (ref 70–99)
Potassium: 4.4 mEq/L (ref 3.5–5.1)
Sodium: 140 mEq/L (ref 135–145)
Total Bilirubin: 0.4 mg/dL (ref 0.2–1.2)
Total Protein: 6.7 g/dL (ref 6.0–8.3)

## 2020-08-23 LAB — MICROALBUMIN / CREATININE URINE RATIO
Creatinine,U: 223.4 mg/dL
Microalb Creat Ratio: 0.5 mg/g (ref 0.0–30.0)
Microalb, Ur: 1.2 mg/dL (ref 0.0–1.9)

## 2020-08-23 LAB — VITAMIN D 25 HYDROXY (VIT D DEFICIENCY, FRACTURES): VITD: 38.24 ng/mL (ref 30.00–100.00)

## 2020-08-23 LAB — HEMOGLOBIN A1C: Hgb A1c MFr Bld: 8.2 % — ABNORMAL HIGH (ref 4.6–6.5)

## 2020-08-23 MED ORDER — AMLODIPINE-OLMESARTAN 10-40 MG PO TABS: 1.0000 | ORAL_TABLET | Freq: Every morning | ORAL | 0 refills | Status: DC

## 2020-08-23 MED ORDER — METOPROLOL SUCCINATE ER 100 MG PO TB24: ORAL_TABLET | ORAL | 3 refills | Status: DC

## 2020-08-23 MED ORDER — ALPRAZOLAM 0.5 MG PO TABS: 0.2500 mg | ORAL_TABLET | Freq: Every day | ORAL | 2 refills | Status: AC | PRN

## 2020-08-23 MED ORDER — FAMCICLOVIR 250 MG PO TABS: 250.0000 mg | ORAL_TABLET | Freq: Two times a day (BID) | ORAL | 3 refills | Status: DC

## 2020-08-23 MED ORDER — ESCITALOPRAM OXALATE 20 MG PO TABS: 20.0000 mg | ORAL_TABLET | Freq: Every day | ORAL | 3 refills | Status: DC

## 2020-08-23 MED ORDER — METFORMIN HCL 1000 MG PO TABS: ORAL_TABLET | ORAL | 3 refills | Status: DC

## 2020-08-23 NOTE — Progress Notes (Signed)
[name] is a 65 y.o. female  Chief Complaint  Patient presents with   Annual Exam    CPE.     HPI: [name] is a 65 y.o. female patient seen today for annual exam, labs. She is fasting for labs.   Last PAP: s/p TAH d/t endometrial cancer Last mammo: 10/2017 - overdue Last colonoscopy: 09/2012 - due in 09/2022  Dental: UTD Vision: wears glasses and UTD on exam  Med refills needed today? Yes per order  Declines shingles and pneumonia vaccine.    Past Medical History:  Diagnosis Date   Anxiety    Cancer ([place])    endometrial   Complication of anesthesia    slow to awaken in past   Depression    Hypertension    Kidney, benign tumor checked every 5 years   right kidney spot   Urticaria     Past Surgical History:  Procedure Laterality Date   APPENDECTOMY     BTL     HIATAL HERNIA REPAIR  12/1999   ROBOTIC ASSISTED TOTAL HYSTERECTOMY WITH BILATERAL SALPINGO OOPHERECTOMY Bilateral 06/23/2012   Procedure: ROBOTIC ASSISTED TOTAL HYSTERECTOMY WITH BILATERAL SALPINGO OOPHORECTOMY  LYMPH NODES, POSSIBLE LAPAROTOMY ;  Surgeon: Imagene Gurney A. Alycia Rossetti, MD;  Location: [facility];  Service: Gynecology;  Laterality: Bilateral;   TUBAL LIGATION      Social History   Socioeconomic History   Marital status: Widowed    Spouse name: Not on file   Number of children: Not on file   Years of education: Not on file   Highest education level: Not on file  Occupational History   Not on file  Tobacco Use   Smoking status: Never   Smokeless tobacco: Never  Vaping Use   Vaping Use: Never used  Substance and Sexual Activity   Alcohol use: Yes    Alcohol/week: 1.0 standard drink    Types: 1 Glasses of wine per week    Comment: occas   Drug use: No   Sexual activity: Never  Other Topics Concern   Not on file  Social History Narrative   Not on file   Social Determinants of Health   Financial Resource Strain: Not on file  Food Insecurity: Not on file  Transportation  Needs: Not on file  Physical Activity: Not on file  Stress: Not on file  Social Connections: Not on file  Intimate Partner Violence: Not on file    Family History  Problem Relation Age of Onset   Hypertension Mother    Diabetes Father    Cancer Father    Hypertension Father    Hypertension Sister    Cancer Brother    Hypertension Brother    Cancer Paternal Aunt    Cancer Maternal Grandmother    Cancer Paternal Grandmother      Immunization History  Administered Date(s) Administered   Engineer, maintenance (J&J) SARS-COV-2 Vaccination 04/06/2019, 01/24/2020   Tdap 08/31/2019    Outpatient Encounter Medications as of 08/23/2020  Medication Sig   atorvastatin (LIPITOR) 40 MG tablet TAKE 1 TABLET(40 MG) BY MOUTH DAILY   [DISCONTINUED] ALPRAZolam (XANAX) 0.5 MG tablet Take 0.5 tablets (0.25 mg total) by mouth daily as needed for sleep or anxiety. 1/2 tab to 1 tab   [DISCONTINUED] amLODipine-olmesartan (AZOR) 10-40 MG tablet TAKE 1 TABLET BY MOUTH EVERY MORNING   [DISCONTINUED] escitalopram (LEXAPRO) 20 MG tablet TAKE 1 TABLET(20 MG) BY MOUTH DAILY   [DISCONTINUED] famciclovir (FAMVIR) 250 MG tablet Take 1 tablet (  250 mg total) by mouth 2 (two) times daily.   [DISCONTINUED] metFORMIN (GLUCOPHAGE) 1000 MG tablet TAKE 1 TABLET(1000 MG) BY MOUTH TWICE DAILY WITH A MEAL   [DISCONTINUED] metoprolol succinate (TOPROL-XL) 100 MG 24 hr tablet TAKE 1 TABLET BY MOUTH EVERY MORNING. TAKE WITH OR IMMEDIATELY FOLLOWING A MEAL   ALPRAZolam (XANAX) 0.5 MG tablet Take 0.5 tablets (0.25 mg total) by mouth daily as needed for sleep or anxiety. 1/2 tab to 1 tab   amLODipine-olmesartan (AZOR) 10-40 MG tablet Take 1 tablet by mouth every morning.   COVID-19 Ad26 vaccine, JANSSEN/J&J, 0.5 ML injection INJECT AS DIRECTED   escitalopram (LEXAPRO) 20 MG tablet Take 1 tablet (20 mg total) by mouth daily.   famciclovir (FAMVIR) 250 MG tablet Take 1 tablet (250 mg total) by mouth 2 (two) times daily.   metFORMIN  (GLUCOPHAGE) 1000 MG tablet TAKE 1 TABLET(1000 MG) BY MOUTH DAILY WITH A MEAL   metoprolol succinate (TOPROL-XL) 100 MG 24 hr tablet TAKE 1 TABLET BY MOUTH EVERY MORNING. TAKE WITH OR IMMEDIATELY FOLLOWING A MEAL   [DISCONTINUED] esomeprazole (NEXIUM) 20 MG capsule Take 20 mg by mouth daily at 12 noon. (Patient not taking: Reported on 08/23/2020)   [DISCONTINUED] FLAXSEED, LINSEED, PO With omega 3. (Patient not taking: Reported on 08/23/2020)   No facility-administered encounter medications on file as of 08/23/2020.     ROS: Gen: no fever, chills  Skin: no rash, itching ENT: no ear pain, ear drainage, nasal congestion, rhinorrhea, sinus pressure, sore throat Eyes: no blurry vision, double vision Resp: no cough, wheeze,SOB Breast: no breast tenderness, no nipple discharge, no breast masses CV: no CP, palpitations, LE edema,  GI: no heartburn, n/v/d/c, abd pain GU: no dysuria, urgency, frequency, hematuria MSK: no joint pain, myalgias, back pain Neuro: no dizziness, headache, weakness, vertigo Psych: no depression, anxiety, insomnia   Allergies  Allergen Reactions   Cola (Syrup) Anaphylaxis   Sulfa Antibiotics Hives and Rash    BP 126/72 (BP Location: Left Arm, Patient Position: Sitting, Cuff Size: Normal)   Pulse 82   Temp (!) 97.1 F (36.2 C) (Temporal)   Ht '5\' 4"'$  (1.626 m)   LMP 05/08/2012   SpO2 97%   BMI 37.76 kg/m  Wt Readings from Last 3 Encounters:  12/16/19 220 lb (99.8 kg)  07/30/19 215 lb (97.5 kg)  06/02/19 214 lb 12.8 oz (97.4 kg)   Temp Readings from Last 3 Encounters:  08/23/20 (!) 97.1 F (36.2 C) (Temporal)  12/16/19 98.7 F (37.1 C) (Temporal)  07/30/19 (!) 96.6 F (35.9 C) (Tympanic)   BP Readings from Last 3 Encounters:  08/23/20 126/72  07/30/19 128/82  06/02/19 122/80   Pulse Readings from Last 3 Encounters:  08/23/20 82  07/30/19 99  06/02/19 98     Physical Exam Constitutional:      General: She is not in acute distress.     Appearance: She is well-developed.  HENT:     Head: Normocephalic and atraumatic.     Right Ear: Tympanic membrane and ear canal normal.     Left Ear: Tympanic membrane and ear canal normal.     Nose: Nose normal.     Mouth/Throat:     Mouth: Mucous membranes are moist.     Pharynx: Oropharynx is clear.  Eyes:     Conjunctiva/sclera: Conjunctivae normal.  Neck:     Thyroid: No thyromegaly.  Cardiovascular:     Rate and Rhythm: Normal rate and regular rhythm.  Pulses: Normal pulses.     Heart sounds: No murmur heard. Pulmonary:     Effort: Pulmonary effort is normal. No respiratory distress.     Breath sounds: Normal breath sounds. No wheezing or rhonchi.  Abdominal:     General: Bowel sounds are normal. There is no distension.     Palpations: Abdomen is soft. There is no mass.     Tenderness: There is no abdominal tenderness.  Musculoskeletal:     Cervical back: Neck supple.     Right lower leg: No edema.     Left lower leg: No edema.  Lymphadenopathy:     Cervical: No cervical adenopathy.  Skin:    General: Skin is warm and dry.  Neurological:     Mental Status: She is alert and oriented to person, place, and time.     Motor: No abnormal muscle tone.     Coordination: Coordination normal.  Psychiatric:        Behavior: Behavior normal.     A/P:  1. Annual physical exam - discussed importance of regular CV exercise, healthy diet, adequate sleep - due for mammo - referral placed today - UTD on colonoscopy - due in 09/2022 - s/p TAH d/t endometrial carcinoma - declines shingrix and pneumonia vaccines today - labs per orders - next CPE in 1 year  2. Essential hypertension - controlled, at goal - CBC - Comprehensive metabolic panel Refill: - metoprolol succinate (TOPROL-XL) 100 MG 24 hr tablet; TAKE 1 TABLET BY MOUTH EVERY MORNING. TAKE WITH OR IMMEDIATELY FOLLOWING A MEAL  Dispense: 90 tablet; Refill: 3 - amLODipine-olmesartan (AZOR) 10-40 MG tablet; Take 1  tablet by mouth every morning.  Dispense: 90 tablet; Refill: 0  3. Hyperlipidemia associated with type 2 diabetes mellitus ([place]) - on lipitor '40mg'$  daily - Comprehensive metabolic panel - Lipid panel  4. Type 2 diabetes mellitus without complication, without long-term current use of insulin (HCC) - Comprehensive metabolic panel - Lipid panel - Hemoglobin A1c - Microalbumin / creatinine urine ratio Refill: - metFORMIN (GLUCOPHAGE) 1000 MG tablet; TAKE 1 TABLET(1000 MG) BY MOUTH DAILY WITH A MEAL  Dispense: 90 tablet; Refill: 3  5. Vitamin D deficiency - VITAMIN D 25 Hydroxy (Vit-D Deficiency, Fractures)  6. Encounter for screening mammogram for malignant neoplasm of breast - MM DIGITAL SCREENING BILATERAL; Future  7. Pneumococcal vaccination declined by patient  8. GAD (generalized anxiety disorder) - stable Refill: - escitalopram (LEXAPRO) 20 MG tablet; Take 1 tablet (20 mg total) by mouth daily.  Dispense: 90 tablet; Refill: 3 - ALPRAZolam (XANAX) 0.5 MG tablet; Take 0.5 tablets (0.25 mg total) by mouth daily as needed for sleep or anxiety. 1/2 tab to 1 tab  Dispense: 30 tablet; Refill: 2  9. Genital herpes simplex, unspecified site - controlled Refill: - famciclovir (FAMVIR) 250 MG tablet; Take 1 tablet (250 mg total) by mouth 2 (two) times daily.  Dispense: 180 tablet; Refill: 3    This visit occurred during the SARS-CoV-2 public health emergency.  Safety protocols were in place, including screening questions prior to the visit, additional usage of staff PPE, and extensive cleaning of exam room while observing appropriate contact time as indicated for disinfecting solutions.

## 2020-08-23 NOTE — Patient Instructions (Signed)
Health Maintenance, Female Adopting a healthy lifestyle and getting preventive care are important in promoting health and wellness. Ask your health care provider about: The right schedule for you to have regular tests and exams. Things you can do on your own to prevent diseases and keep yourself healthy. What should I know about diet, weight, and exercise? Eat a healthy diet  Eat a diet that includes plenty of vegetables, fruits, low-fat dairy products, and lean protein. Do not eat a lot of foods that are high in solid fats, added sugars, or sodium.  Maintain a healthy weight Body mass index (BMI) is used to identify weight problems. It estimates body fat based on height and weight. Your health care provider can help determineyour BMI and help you achieve or maintain a healthy weight. Get regular exercise Get regular exercise. This is one of the most important things you can do for your health. Most adults should: Exercise for at least 150 minutes each week. The exercise should increase your heart rate and make you sweat (moderate-intensity exercise). Do strengthening exercises at least twice a week. This is in addition to the moderate-intensity exercise. Spend less time sitting. Even light physical activity can be beneficial. Watch cholesterol and blood lipids Have your blood tested for lipids and cholesterol at 64 years of age, then havethis test every 5 years. Have your cholesterol levels checked more often if: Your lipid or cholesterol levels are high. You are older than 64 years of age. You are at high risk for heart disease. What should I know about cancer screening? Depending on your health history and family history, you may need to have cancer screening at various ages. This may include screening for: Breast cancer. Cervical cancer. Colorectal cancer. Skin cancer. Lung cancer. What should I know about heart disease, diabetes, and high blood pressure? Blood pressure and heart  disease High blood pressure causes heart disease and increases the risk of stroke. This is more likely to develop in people who have high blood pressure readings, are of African descent, or are overweight. Have your blood pressure checked: Every 3-5 years if you are 18-39 years of age. Every year if you are 40 years old or older. Diabetes Have regular diabetes screenings. This checks your fasting blood sugar level. Have the screening done: Once every three years after age 40 if you are at a normal weight and have a low risk for diabetes. More often and at a younger age if you are overweight or have a high risk for diabetes. What should I know about preventing infection? Hepatitis B If you have a higher risk for hepatitis B, you should be screened for this virus. Talk with your health care provider to find out if you are at risk forhepatitis B infection. Hepatitis C Testing is recommended for: Everyone born from 1945 through 1965. Anyone with known risk factors for hepatitis C. Sexually transmitted infections (STIs) Get screened for STIs, including gonorrhea and chlamydia, if: You are sexually active and are younger than 64 years of age. You are older than 64 years of age and your health care provider tells you that you are at risk for this type of infection. Your sexual activity has changed since you were last screened, and you are at increased risk for chlamydia or gonorrhea. Ask your health care provider if you are at risk. Ask your health care provider about whether you are at high risk for HIV. Your health care provider may recommend a prescription medicine to help   prevent HIV infection. If you choose to take medicine to prevent HIV, you should first get tested for HIV. You should then be tested every 3 months for as long as you are taking the medicine. Pregnancy If you are about to stop having your period (premenopausal) and you may become pregnant, seek counseling before you get  pregnant. Take 400 to 800 micrograms (mcg) of folic acid every day if you become pregnant. Ask for birth control (contraception) if you want to prevent pregnancy. Osteoporosis and menopause Osteoporosis is a disease in which the bones lose minerals and strength with aging. This can result in bone fractures. If you are 65 years old or older, or if you are at risk for osteoporosis and fractures, ask your health care provider if you should: Be screened for bone loss. Take a calcium or vitamin D supplement to lower your risk of fractures. Be given hormone replacement therapy (HRT) to treat symptoms of menopause. Follow these instructions at home: Lifestyle Do not use any products that contain nicotine or tobacco, such as cigarettes, e-cigarettes, and chewing tobacco. If you need help quitting, ask your health care provider. Do not use street drugs. Do not share needles. Ask your health care provider for help if you need support or information about quitting drugs. Alcohol use Do not drink alcohol if: Your health care provider tells you not to drink. You are pregnant, may be pregnant, or are planning to become pregnant. If you drink alcohol: Limit how much you use to 0-1 drink a day. Limit intake if you are breastfeeding. Be aware of how much alcohol is in your drink. In the [place] one drink equals one 12 oz bottle of beer (355 mL), one 5 oz glass of wine (148 mL), or one 1 oz glass of hard liquor (44 mL). General instructions Schedule regular health, dental, and eye exams. Stay current with your vaccines. Tell your health care provider if: You often feel depressed. You have ever been abused or do not feel safe at home. Summary Adopting a healthy lifestyle and getting preventive care are important in promoting health and wellness. Follow your health care provider's instructions about healthy diet, exercising, and getting tested or screened for diseases. Follow your health care provider's  instructions on monitoring your cholesterol and blood pressure. This information is not intended to replace advice given to you by your health care provider. Make sure you discuss any questions you have with your healthcare provider. Document Revised: 01/07/2018 Document Reviewed: 01/07/2018 Elsevier Patient Education  2022 Elsevier Inc.  

## 2020-08-25 ENCOUNTER — Encounter: Payer: Self-pay | Admitting: Family Medicine

## 2020-08-25 ENCOUNTER — Other Ambulatory Visit: Payer: Self-pay | Admitting: Family Medicine

## 2020-08-25 DIAGNOSIS — E119 Type 2 diabetes mellitus without complications: Secondary | ICD-10-CM

## 2020-08-25 MED ORDER — PIOGLITAZONE HCL 15 MG PO TABS: 15.0000 mg | ORAL_TABLET | Freq: Every day | ORAL | 1 refills | Status: DC

## 2021-03-06 ENCOUNTER — Other Ambulatory Visit: Payer: Self-pay

## 2021-03-07 ENCOUNTER — Encounter: Payer: Self-pay | Admitting: Family Medicine

## 2021-03-07 ENCOUNTER — Ambulatory Visit: Payer: No Typology Code available for payment source | Admitting: Family Medicine

## 2021-03-07 VITALS — BP 114/72 | HR 70 | Temp 97.6°F | Ht 64.0 in | Wt 205.0 lb

## 2021-03-07 DIAGNOSIS — E119 Type 2 diabetes mellitus without complications: Secondary | ICD-10-CM

## 2021-03-07 DIAGNOSIS — F411 Generalized anxiety disorder: Secondary | ICD-10-CM | POA: Diagnosis not present

## 2021-03-07 DIAGNOSIS — E782 Mixed hyperlipidemia: Secondary | ICD-10-CM

## 2021-03-07 DIAGNOSIS — H9319 Tinnitus, unspecified ear: Secondary | ICD-10-CM | POA: Insufficient documentation

## 2021-03-07 DIAGNOSIS — K219 Gastro-esophageal reflux disease without esophagitis: Secondary | ICD-10-CM

## 2021-03-07 DIAGNOSIS — I10 Essential (primary) hypertension: Secondary | ICD-10-CM

## 2021-03-07 DIAGNOSIS — H9313 Tinnitus, bilateral: Secondary | ICD-10-CM

## 2021-03-07 DIAGNOSIS — Z1231 Encounter for screening mammogram for malignant neoplasm of breast: Secondary | ICD-10-CM

## 2021-03-07 DIAGNOSIS — Z6835 Body mass index (BMI) 35.0-35.9, adult: Secondary | ICD-10-CM

## 2021-03-07 DIAGNOSIS — I493 Ventricular premature depolarization: Secondary | ICD-10-CM | POA: Insufficient documentation

## 2021-03-07 NOTE — Progress Notes (Signed)
[facility] [address] [place] [address] Dept: [phone] Dept Fax: [phone]  Transfer of Care Office Visit  Subjective:    Patient ID: [name], female    DOB: 04/21/1955, 66 y.o..   MRN: [id number]  Chief Complaint  Patient presents with   Establish Care    [facility]- establish care.      History of Present Illness:  Patient is in today to establish care. Ms. [name] was born in [place], [place]. She moved to [place] in 1972. She attended [employer] and has a degree in mathematics. She is a widow (since 2004). She has two sons (33,34) living in [place] and [place], [place]. She has two grandchildren by one son.  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 She denies tobacco or drug use and only drinks rarely.  Ms. [name] has a history of Type 2 diabetes. She is currently managed on metformin. She had been prescribed pioglitazone last summer, but never took this. Instead, she has focused on diet and exercise, wanting to avoid more medication.  Ms. [name] has a history of hypertension. She is managed on amlodipine-olmasartan [employer]). She also takes metoprolol, but this is apparently more for control of frequent PVCs.  Ms. [name] has a history of hyperlipidemia. She is managed on atorvastatin.  Ms. [name] has concerns about her weight. More recently, she has started to do intermittent fasting. She has only been doing this about every other day, as she felt she needed to check with her doctor first. She has seen about 7 lbs. of weight loss.  Ms. [name] has a history of Anxiety. She notes this was worse soon after her husband's death. She is currently managed on escitalopram. She has some Xanax on hand, but only rarely needs to use this.  Ms. [name] has a history of genital herpes. She takes a daily famciclovir for prevention, which has worked well.  Ms. [name] has a history of GERD. She  previously had surgery for a hiatal hernia. She is managed on Nexium, but has concerns about taking this more long term.  Ms. [name] has  a history of tinnitus. She has not had associated hearing loss or vertigo and does not regularly take aspirin.  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 Duard Brady, MD;  Location: [facility];  Service: Gynecology;  Laterality: Bilateral;   TUBAL LIGATION     Family History  Problem Relation Age of Onset   Stroke Mother    Hypertension Mother    Dementia Mother    Heart disease Father  Diabetes Father    Hypertension Father    Hypertension Sister    Cancer Brother        Melanoma   Hypertension Brother    Cancer Paternal Aunt    Cancer Maternal Grandmother    Cancer Paternal Grandmother    Outpatient Medications Prior to Visit  Medication Sig Dispense Refill   ALPRAZolam (XANAX) 0.5 MG tablet Take 0.5 tablets (0.25 mg total) by mouth daily as needed for sleep or anxiety. 1/2 tab to 1 tab 30 tablet 2   amLODipine-olmesartan (AZOR) 10-40 MG tablet Take 1 tablet  by mouth every morning. 90 tablet 0   atorvastatin (LIPITOR) 40 MG tablet TAKE 1 TABLET(40 MG) BY MOUTH DAILY 90 tablet 3   escitalopram (LEXAPRO) 20 MG tablet Take 1 tablet (20 mg total) by mouth daily. 90 tablet 3   esomeprazole (NEXIUM) 40 MG capsule Take 1 capsule by mouth daily.     famciclovir (FAMVIR) 250 MG tablet Take 1 tablet (250 mg total) by mouth 2 (two) times daily. (Patient taking differently: Take 250 mg by mouth daily.) 180 tablet 3   metFORMIN (GLUCOPHAGE) 1000 MG tablet TAKE 1 TABLET(1000 MG) BY MOUTH DAILY WITH A MEAL 90 tablet 3   metoprolol succinate (TOPROL-XL) 100 MG 24 hr tablet TAKE 1 TABLET BY MOUTH EVERY MORNING. TAKE WITH OR IMMEDIATELY FOLLOWING A MEAL 90 tablet 3   pioglitazone (ACTOS) 15 MG tablet Take 1 tablet (15 mg total) by mouth daily. 90 tablet 1   No facility-administered medications prior to visit.   Allergies  Allergen Reactions   Cola (Syrup) Anaphylaxis   Sulfa Antibiotics Hives and Rash   Objective:   Today's Vitals   03/07/21 1505  BP: 114/72  Pulse: 70  Temp: 97.6 F (36.4 C)  TempSrc: Temporal  SpO2: 94%  Weight: 205 lb (93 kg)  Height: 5\' 4"  (1.626 m)   Body mass index is 35.19 kg/m.   General: Well developed, well nourished. No acute distress. Feet- Skin intact. No sign of maceration between toes. Nails are normal. Dorsalis pedis and posterior tibial artery pulses   are normal. 5.07 monofilament testing normal. Psych: Alert and oriented x3. Normal mood and affect.  Health Maintenance Due  Topic Date Due   OPHTHALMOLOGY EXAM  Never done   HIV Screening  Never done   Zoster Vaccines- Shingrix (1 of 2) Never done   MAMMOGRAM  11/19/2019   COVID-19 Vaccine (3 - Booster for [employer] series) 03/20/2020   INFLUENZA VACCINE  Never done   HEMOGLOBIN A1C  02/23/2021   Lab Results Last lipids Lab Results  Component Value Date   CHOL 167 08/23/2020   HDL 59.10 08/23/2020   LDLCALC 88 08/23/2020   LDLDIRECT 133.0 06/02/2019    TRIG 96.0 08/23/2020   CHOLHDL 3 08/23/2020   Last hemoglobin A1c Lab Results  Component Value Date   HGBA1C 8.2 (H) 08/23/2020   BMP Latest Ref Rng & Units 08/23/2020 06/02/2019 10/07/2018  Glucose 70 - 99 mg/dL 811(B) 147(W) 295(A)  BUN 6 - 23 mg/dL 12 17 19   Creatinine 0.40 - 1.20 mg/dL 2.13 0.86 5.78  Sodium 135 - 145 mEq/L 140 142 132(L)  Potassium 3.5 - 5.1 mEq/L 4.4 5.1 4.7  Chloride 96 - 112 mEq/L 100 104 96  CO2 19 - 32 mEq/L 29 26 26   Calcium 8.4 - 10.5 mg/dL 46.9 62.9 9.4     Assessment & Plan:   1. Essential hypertension Blood pressure is at goal today. We  discussed possibly reducing her BP meds in light of her efforts at weight loss. If she can get her weight under 200 lbs., I would consider switching her to olmesartan only and then monitor this.  2. Type 2 diabetes mellitus without complication, without long-term current use of insulin (HCC) Having discussed her goals, I feel it could be reasonable for her to work at weight loss through intermittent fasting to hopefully get her off of meds. I recommend she check her fasting glucose about twice a week and a glucose just prior to her noon meal twice a week. I will check her A1c today and plan to reassess her progress in 3 months.   - Glucose, random - Hemoglobin A1c  3. GAD (generalized anxiety disorder) Stable on Lexapro and PRN Xanax.  4. Mixed hyperlipidemia We will repeat lipids today. I did explain that she will likely benefit from remaining on a statin for CV risk reduction, even if we are able to stop other meds.  - Lipid panel  5. Class 2 severe obesity due to excess calories with serious comorbidity and body mass index (BMI) of 35.0 to 35.9 in adult [facility]) Ms. Hunzeker's weight had been at 220 lbs in 11/2019. She will move ahead with an intermittent fasting approach.  6. Gastroesophageal reflux disease without esophagitis We discussed tapering down on her Nexium dose and then switching to either Pepcid or Zantac.  If she tolerates this, we might try stopping medication later on.  7. Tinnitus of both ears Noted. We will follow for now.  8. Encounter for screening mammogram for malignant neoplasm of breast  - MM DIGITAL SCREENING BILATERAL; Future  Loyola Mast, MD

## 2021-03-08 LAB — LIPID PANEL
Cholesterol: 148 mg/dL (ref 0–200)
HDL: 49.1 mg/dL (ref >39.00)
LDL Cholesterol: 70 mg/dL (ref 0–99)
NonHDL: 99.15
Total CHOL/HDL Ratio: 3
Triglycerides: 147 mg/dL (ref 0.0–149.0)
VLDL: 29.4 mg/dL (ref 0.0–40.0)

## 2021-03-08 LAB — GLUCOSE, RANDOM: Glucose, Bld: 207 mg/dL — ABNORMAL HIGH (ref 70–99)

## 2021-03-08 LAB — HEMOGLOBIN A1C: Hgb A1c MFr Bld: 12.8 % — ABNORMAL HIGH (ref 4.6–6.5)

## 2021-03-22 ENCOUNTER — Encounter: Payer: Self-pay | Admitting: Family Medicine

## 2021-03-22 ENCOUNTER — Telehealth: Payer: No Typology Code available for payment source | Admitting: Nurse Practitioner

## 2021-03-22 DIAGNOSIS — U071 COVID-19: Secondary | ICD-10-CM

## 2021-03-22 NOTE — Progress Notes (Signed)
You tested positive for covid and that cannot be treated with antiviral in an evisit. You will need to schedule a virtual urgent care visit for treatment.

## 2021-03-23 ENCOUNTER — Telehealth (INDEPENDENT_AMBULATORY_CARE_PROVIDER_SITE_OTHER): Payer: No Typology Code available for payment source | Admitting: Family Medicine

## 2021-03-23 DIAGNOSIS — E1165 Type 2 diabetes mellitus with hyperglycemia: Secondary | ICD-10-CM | POA: Diagnosis not present

## 2021-03-23 DIAGNOSIS — U071 COVID-19: Secondary | ICD-10-CM | POA: Diagnosis not present

## 2021-03-23 NOTE — Progress Notes (Signed)
[facility]ARE [facility] [address] [place] [address] Dept: [phone] Dept Fax: [phone]  Virtual Video Visit  I connected with [name] on 03/23/21 at 10:00 AM EST by a video enabled telemedicine application and verified that I am speaking with the correct person using two identifiers.  Location patient: Home Location provider: Clinic Persons participating in the virtual visit: Patient, Provider  I discussed the limitations of evaluation and management by telemedicine and the availability of in person appointments. The patient expressed understanding and agreed to proceed.  Chief Complaint  Patient presents with   Acute Visit    C/o having HA, chills, fever 106 - 99.6, cough and nasal drainage x 2-3 days.  Has taken Tylenol and Ibuprofen. +Covid  test on 03/22/21.      SUBJECTIVE:  HPI: [name] is a 66 y.o. female who presents with a 3-day history of headache, chills, fever (up to 100.6 F), cough, and nasal drainage. Her headache has improved. She did experience some mild diarrhea yesterday. She denies any dyspnea. She is managing her symptoms with ibuprofen and Tylenol.  Additionally, at her recent visit tot he clinic, [name] was found to have an A1c that was quite high. She has a history of Type 2 diabetes. She is currently managed on metformin. She had been prescribed pioglitazone last summer, but never took this. Instead, she has focused on diet and exercise, wanting to avoid more medication. I recommended she consider the addition of another agent. She notes she would prefer to work at her weight loss.  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 Alycia Rossetti, MD;  Location: [facility];  Service: Gynecology;  Laterality: Bilateral;   TUBAL LIGATION     Family History  Problem Relation Age of Onset   Stroke Mother    Hypertension Mother    Dementia Mother    Heart disease Father    Diabetes Father    Hypertension Father    Hypertension Sister    Cancer Brother        Melanoma   Hypertension Brother    Cancer Paternal Aunt    Cancer Maternal Grandmother    Cancer Paternal Grandmother    Social History   Tobacco Use   Smoking status: Never   Smokeless tobacco: Never  Vaping Use   Vaping Use: Never used  Substance Use Topics   Alcohol use: Yes    Alcohol/week: 1.0 standard drink    Types: 1 Glasses of wine per week  Comment: occas   Drug use: No    Current Outpatient Medications:    ALPRAZolam (XANAX) 0.5 MG tablet, Take 0.5 tablets (0.25 mg total) by mouth daily as needed for sleep or anxiety. 1/2 tab to 1 tab, Disp: 30 tablet, Rfl: 2   amLODipine-olmesartan (AZOR) 10-40 MG tablet, Take 1 tablet by mouth every morning., Disp: 90 tablet, Rfl: 0   atorvastatin (LIPITOR) 40 MG tablet, TAKE 1 TABLET(40 MG) BY MOUTH DAILY, Disp: 90 tablet, Rfl: 3   escitalopram (LEXAPRO) 20 MG tablet,  Take 1 tablet (20 mg total) by mouth daily., Disp: 90 tablet, Rfl: 3   esomeprazole (NEXIUM) 40 MG capsule, Take 1 capsule by mouth daily. 1/2 every other day, Disp: , Rfl:    famciclovir (FAMVIR) 250 MG tablet, Take 1 tablet (250 mg total) by mouth 2 (two) times daily. (Patient taking differently: Take 250 mg by mouth daily.), Disp: 180 tablet, Rfl: 3   metFORMIN (GLUCOPHAGE) 1000 MG tablet, TAKE 1 TABLET(1000 MG) BY MOUTH DAILY WITH A MEAL, Disp: 90 tablet, Rfl: 3   metoprolol succinate (TOPROL-XL) 100 MG 24 hr tablet, TAKE 1 TABLET BY MOUTH EVERY MORNING. TAKE WITH OR IMMEDIATELY FOLLOWING A MEAL, Disp: 90 tablet, Rfl: 3  Allergies  Allergen Reactions   Cola (Syrup) Anaphylaxis   Sulfa Antibiotics Hives and Rash   ROS: See pertinent positives and negatives per HPI.  OBSERVATIONS/OBJECTIVE:  VITALS per patient if applicable: There were no vitals filed for this visit.   GENERAL: Alert and oriented. Appears well and in no acute distress.  HEENT: Atraumatic. Conjunctiva clear. No obvious abnormalities on inspection of external nose and ears.  NECK: Normal movements of the head and neck.  LUNGS: On inspection, no signs of respiratory distress. Breathing rate appears normal. No obvious gross SOB, gasping or wheezing, and no conversational dyspnea.  CV: No obvious cyanosis.  PSYCH/NEURO: Pleasant and cooperative. No obvious depression or anxiety. Speech and thought processing grossly intact.  Lab Results: Lab Results  Component Value Date   HGBA1C 12.8 (H) 03/07/2021   BMP Latest Ref Rng & Units 03/07/2021 08/23/2020 06/02/2019  Glucose 70 - 99 mg/dL 207(H) 221(H) 171(H)  BUN 6 - 23 mg/dL - 12 17  Creatinine 0.40 - 1.20 mg/dL - 0.85 0.98  Sodium 135 - 145 mEq/L - 140 142  Potassium 3.5 - 5.1 mEq/L - 4.4 5.1  Chloride 96 - 112 mEq/L - 100 104  CO2 19 - 32 mEq/L - 29 26  Calcium 8.4 - 10.5 mg/dL - 10.0 10.1   ASSESSMENT AND PLAN:  1. COVID-19 Reviewed home care instructions for  COVID. Advised self-isolation at home for at least 5 days. After 5 days, if improved and fever resolved, can be in public, but should wear a mask around others for an additional 5 days. If symptoms, esp, dyspnea develops/worsens, recommend in-person evaluation at either an urgent care or the emergency room. [name]'s COVID risk is moderate. She has had primary vaccination. I provided her with the option to consider an antiviral. It was our joint decision that she would likely have little benefit from this, so she declined.  2. Uncontrolled type 2 diabetes mellitus with hyperglycemia ([place]) [name] has had 15 lb. of weight loss since 11/2019. We did discuss that uncontrolled diabetes could account for this. However, she will hold off on adding additional medications for her diabetes at this point. We will recheck her A1c at her next visit. She states if it remains high, she would  then consider additional meds.  I discussed the assessment and treatment plan with the patient. The patient was provided an opportunity to ask questions and all were answered. The patient agreed with the plan and demonstrated an understanding of the instructions.   The patient was advised to call back or seek an in-person evaluation if the symptoms worsen or if the condition fails to improve as anticipated.  Return if symptoms worsen or fail to improve.   Haydee Salter, MD

## 2021-04-02 ENCOUNTER — Other Ambulatory Visit: Payer: Self-pay

## 2021-04-02 DIAGNOSIS — E785 Hyperlipidemia, unspecified: Secondary | ICD-10-CM

## 2021-04-02 DIAGNOSIS — E1169 Type 2 diabetes mellitus with other specified complication: Secondary | ICD-10-CM

## 2021-04-02 MED ORDER — ATORVASTATIN CALCIUM 40 MG PO TABS: ORAL_TABLET | ORAL | 1 refills | Status: DC

## 2021-04-10 ENCOUNTER — Other Ambulatory Visit: Payer: Self-pay | Admitting: Family Medicine

## 2021-04-10 DIAGNOSIS — Z09 Encounter for follow-up examination after completed treatment for conditions other than malignant neoplasm: Secondary | ICD-10-CM

## 2021-04-13 ENCOUNTER — Other Ambulatory Visit: Payer: Self-pay | Admitting: Family Medicine

## 2021-04-13 DIAGNOSIS — I10 Essential (primary) hypertension: Secondary | ICD-10-CM

## 2021-04-13 MED ORDER — AMLODIPINE-OLMESARTAN 10-40 MG PO TABS: 1.0000 | ORAL_TABLET | Freq: Every morning | ORAL | 3 refills | Status: DC

## 2021-04-13 NOTE — Telephone Encounter (Signed)
Refill request for  ?Azor 10/40/mg ?LR 08/23/20, #90, 0 rf (Dr Bryan Lemma) ?LOV 03/07/21 ?FOV none scheduled. ? ?Please review and advise.  ?Thanks. Dm/cma ? ?

## 2021-04-13 NOTE — Telephone Encounter (Signed)
Caller Name: Mattelyn Gaskill ?Call back phone #: [phone] ?MEDICATION(S): amLODipine-olmesartan (AZOR) 10-40 MG tablet [[id number]]  ? ? ?Days of Med Remaining:  ? ?Has the patient contacted their pharmacy (YES/NO)?  Yes they told her to call her pcp ?IF YES, when and what did the pharmacy advise?  ?IF NO, request that the patient contact the pharmacy for the refills in the future.  ?           The pharmacy will send an electronic request (except for controlled medications). ? ?Preferred Pharmacy: [facility] #[id number] [address], [address] AT [facility] OF [address]  ?[place], [place] [address]  ?Phone:  [phone]  Fax:  [phone]  ?DEA #:  [id number] ? ?~~~Please advise patient/caregiver to allow 2-3 business days to process RX refills. ? ?

## 2021-04-16 NOTE — Telephone Encounter (Signed)
Unable to leave message no VM.  Dm/cma ? ?

## 2021-04-16 NOTE — Telephone Encounter (Signed)
Rx sent 04/13/21. Will call patient to advise.  Dm/cma ? ?

## 2021-05-02 ENCOUNTER — Other Ambulatory Visit: Payer: No Typology Code available for payment source

## 2021-06-01 ENCOUNTER — Ambulatory Visit
Admission: RE | Admit: 2021-06-01 | Discharge: 2021-06-01 | Disposition: A | Discharge status: Home / Self Care | Payer: No Typology Code available for payment source | Source: Ambulatory Visit | Attending: Family Medicine | Admitting: Family Medicine

## 2021-06-01 ENCOUNTER — Other Ambulatory Visit: Payer: Self-pay | Admitting: Family Medicine

## 2021-06-01 ENCOUNTER — Ambulatory Visit: Payer: No Typology Code available for payment source | Admitting: Family Medicine

## 2021-06-01 DIAGNOSIS — Z09 Encounter for follow-up examination after completed treatment for conditions other than malignant neoplasm: Secondary | ICD-10-CM

## 2021-06-01 DIAGNOSIS — R928 Other abnormal and inconclusive findings on diagnostic imaging of breast: Secondary | ICD-10-CM

## 2021-06-22 ENCOUNTER — Ambulatory Visit: Payer: No Typology Code available for payment source | Admitting: Family Medicine

## 2021-09-05 ENCOUNTER — Other Ambulatory Visit: Payer: Self-pay

## 2021-09-05 DIAGNOSIS — F411 Generalized anxiety disorder: Secondary | ICD-10-CM

## 2021-09-05 MED ORDER — ESCITALOPRAM OXALATE 20 MG PO TABS: 20.0000 mg | ORAL_TABLET | Freq: Every day | ORAL | 0 refills | Status: DC

## 2021-10-14 ENCOUNTER — Other Ambulatory Visit: Payer: Self-pay | Admitting: Family Medicine

## 2021-10-14 DIAGNOSIS — E1169 Type 2 diabetes mellitus with other specified complication: Secondary | ICD-10-CM

## 2021-10-14 DIAGNOSIS — F411 Generalized anxiety disorder: Secondary | ICD-10-CM

## 2021-10-17 ENCOUNTER — Other Ambulatory Visit: Payer: Self-pay | Admitting: Family Medicine

## 2021-10-17 DIAGNOSIS — E1169 Type 2 diabetes mellitus with other specified complication: Secondary | ICD-10-CM

## 2021-10-20 ENCOUNTER — Other Ambulatory Visit: Payer: Self-pay | Admitting: Family Medicine

## 2021-10-20 DIAGNOSIS — F411 Generalized anxiety disorder: Secondary | ICD-10-CM

## 2021-11-29 ENCOUNTER — Telehealth: Payer: Self-pay

## 2021-11-29 NOTE — Telephone Encounter (Signed)
Left VM to RTN call to schedule a f/u appointment for meds.  Once appointment scheduled we can send a 30 day supply of Metoprolol Succinate 100 mg to [facility] for her.  Dm/cma

## 2021-12-03 ENCOUNTER — Telehealth: Payer: Self-pay | Admitting: Family Medicine

## 2021-12-03 ENCOUNTER — Other Ambulatory Visit: Payer: Self-pay | Admitting: Family Medicine

## 2021-12-03 ENCOUNTER — Ambulatory Visit
Admission: RE | Admit: 2021-12-03 | Discharge: 2021-12-03 | Disposition: A | Discharge status: Home / Self Care | Payer: No Typology Code available for payment source | Source: Ambulatory Visit | Attending: Family Medicine | Admitting: Family Medicine

## 2021-12-03 DIAGNOSIS — R928 Other abnormal and inconclusive findings on diagnostic imaging of breast: Secondary | ICD-10-CM

## 2021-12-03 DIAGNOSIS — I10 Essential (primary) hypertension: Secondary | ICD-10-CM

## 2021-12-03 DIAGNOSIS — N6489 Other specified disorders of breast: Secondary | ICD-10-CM

## 2021-12-03 MED ORDER — METOPROLOL SUCCINATE ER 100 MG PO TB24: ORAL_TABLET | ORAL | 0 refills | Status: DC

## 2021-12-03 NOTE — Telephone Encounter (Signed)
Lft VM  that RX was sent to the pharmacy. Dm/cma  

## 2021-12-03 NOTE — Telephone Encounter (Signed)
Caller Name: Ying Call back phone #: [phone]   MEDICATION(S):  metoprolol succinate (TOPROL-XL) 100 MG 24 hr tablet [[id number]]   Days of Med Remaining:   Has the patient contacted their pharmacy (YES/NO)? no What did pharmacy advise?   Preferred Pharmacy:  [facility] #[id number] - [place], [address] AT [facility] OF [address] Phone: [phone]      ~~~Please advise patient/caregiver to allow 2-3 business days to process RX refills.

## 2021-12-10 ENCOUNTER — Ambulatory Visit
Admission: RE | Admit: 2021-12-10 | Discharge: 2021-12-10 | Disposition: A | Discharge status: Home / Self Care | Payer: No Typology Code available for payment source | Source: Ambulatory Visit | Attending: Family Medicine | Admitting: Family Medicine

## 2021-12-10 DIAGNOSIS — N6489 Other specified disorders of breast: Secondary | ICD-10-CM

## 2021-12-10 DIAGNOSIS — R928 Other abnormal and inconclusive findings on diagnostic imaging of breast: Secondary | ICD-10-CM

## 2021-12-10 HISTORY — PX: BREAST BIOPSY: SHX20

## 2021-12-17 ENCOUNTER — Encounter: Payer: Self-pay | Admitting: Family Medicine

## 2021-12-17 ENCOUNTER — Ambulatory Visit: Payer: No Typology Code available for payment source | Admitting: Family Medicine

## 2021-12-17 VITALS — BP 124/76 | HR 81 | Temp 97.8°F | Ht 64.0 in | Wt 213.6 lb

## 2021-12-17 DIAGNOSIS — Z6835 Body mass index (BMI) 35.0-35.9, adult: Secondary | ICD-10-CM

## 2021-12-17 DIAGNOSIS — E119 Type 2 diabetes mellitus without complications: Secondary | ICD-10-CM | POA: Diagnosis not present

## 2021-12-17 DIAGNOSIS — A6000 Herpesviral infection of urogenital system, unspecified: Secondary | ICD-10-CM

## 2021-12-17 DIAGNOSIS — F411 Generalized anxiety disorder: Secondary | ICD-10-CM

## 2021-12-17 DIAGNOSIS — E782 Mixed hyperlipidemia: Secondary | ICD-10-CM

## 2021-12-17 DIAGNOSIS — I10 Essential (primary) hypertension: Secondary | ICD-10-CM | POA: Diagnosis not present

## 2021-12-17 LAB — URINALYSIS, ROUTINE W REFLEX MICROSCOPIC
Hgb urine dipstick: NEGATIVE
Leukocytes,Ua: NEGATIVE
Nitrite: NEGATIVE
Specific Gravity, Urine: 1.025 (ref 1.000–1.030)
Total Protein, Urine: NEGATIVE
Urine Glucose: NEGATIVE
Urobilinogen, UA: 1 (ref 0.0–1.0)
pH: 6 (ref 5.0–8.0)

## 2021-12-17 LAB — MICROALBUMIN / CREATININE URINE RATIO
Creatinine,U: 259 mg/dL
Microalb Creat Ratio: 1.2 mg/g (ref 0.0–30.0)
Microalb, Ur: 3.1 mg/dL — ABNORMAL HIGH (ref 0.0–1.9)

## 2021-12-17 MED ORDER — METFORMIN HCL 1000 MG PO TABS: ORAL_TABLET | ORAL | 3 refills | Status: AC

## 2021-12-17 MED ORDER — AMLODIPINE-OLMESARTAN 10-40 MG PO TABS: 1.0000 | ORAL_TABLET | Freq: Every morning | ORAL | 3 refills | Status: DC

## 2021-12-17 MED ORDER — ATORVASTATIN CALCIUM 40 MG PO TABS: 40.0000 mg | ORAL_TABLET | Freq: Every day | ORAL | 3 refills | Status: DC

## 2021-12-17 MED ORDER — FAMCICLOVIR 250 MG PO TABS: 250.0000 mg | ORAL_TABLET | Freq: Every day | ORAL | 3 refills | Status: DC

## 2021-12-17 MED ORDER — ESCITALOPRAM OXALATE 20 MG PO TABS: 20.0000 mg | ORAL_TABLET | Freq: Every day | ORAL | 3 refills | Status: DC

## 2021-12-17 NOTE — Progress Notes (Signed)
[facility] [address] [place] [address] Dept: [phone] Dept Fax: [phone]  Chronic Care Office Visit  Subjective:    Patient ID: Arteria Boodoo, female    DOB: Aug 03, 1955, 66 y.o..   MRN: [id number]  Chief Complaint  Patient presents with   Follow-up    F/u meds.     History of Present Illness:  Patient is in today for reassessment of chronic medical issues.  Ms. Slocumb has a history of Type 2 diabetes. She is currently managed on metformin 1,000 mg daily. She has struggled to accept that she has not been able to manage this without medication. she feels if she had more willpower, she could overcome this. She notes she is reconciling herself tot he fact that she may need to add more medication to her regimen.   Ms. Kelner has a history of hypertension. She is managed on amlodipine-olmasartan (Azor) 10-40 mg daily. She also takes metoprolol 100 mg daily, but this is apparently more for control of frequent PVCs.   Ms. Groot has a history of hyperlipidemia. She is managed on atorvastatin 40 mg daily.   Ms. Eitzen had some success with intermittent fasting earleir this year. However, she notes she has had multiple stressors that threw her off of these efforts, including the deaht of her mother, her pet dog, and having her brother (who has complicated alcoholism) move in with her.   Ms. Lucking has a history of anxiety and depression. She is managed on escitalopram 20 mg daily. She feels this is doing well. She continues to be judicious with use of alprazolam.   Ms. Fix has a history of genital herpes. She takes a daily famciclovir for prevention, which has worked well.  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   Depressive disorder 07/04/2012   History of endometrial cancer 05/18/2012   Hyperlipidemia 09/25/2011   GAD (generalized anxiety disorder) 07/31/2006   Genital herpes 06/16/2006   Essential hypertension 06/16/2006   Past Surgical History:  Procedure Laterality Date   APPENDECTOMY     BREAST BIOPSY Right 12/10/2021   [place] RT BREAST BX W LOC DEV 1ST LESION IMG BX SPEC US GUIDE 12/10/2021 [facility]   BTL     HIATAL HERNIA REPAIR  12/1999   ROBOTIC ASSISTED TOTAL HYSTERECTOMY WITH BILATERAL SALPINGO OOPHERECTOMY Bilateral 06/23/2012   Procedure: ROBOTIC ASSISTED TOTAL HYSTERECTOMY WITH BILATERAL SALPINGO OOPHORECTOMY  LYMPH NODES, POSSIBLE LAPAROTOMY ;  Surgeon: Rejeana Brock A. Duard Brady, MD;  Location: [facility];  Service: Gynecology;  Laterality: Bilateral;   TUBAL LIGATION     Family History  Problem Relation Age of Onset   Stroke Mother    Hypertension Mother    Dementia Mother    Heart disease Father    Diabetes Father    Hypertension Father    Hypertension Sister    Cancer Brother        Melanoma   Hypertension Brother    Cancer Paternal Aunt    Cancer Maternal Grandmother  Cancer Paternal Grandmother    Outpatient Medications Prior to Visit  Medication Sig Dispense Refill   ALPRAZolam (XANAX) 0.5 MG tablet Take 0.5 tablets (0.25 mg total) by mouth daily as needed for sleep or anxiety. 1/2 tab to 1 tab 30 tablet 2   famotidine (PEPCID) 10 MG tablet Take 10 mg by mouth daily.     metoprolol succinate (TOPROL-XL) 100 MG 24 hr tablet TAKE 1 TABLET BY MOUTH EVERY MORNING. TAKE WITH OR IMMEDIATELY FOLLOWING A MEAL 90 tablet 0   amLODipine-olmesartan (AZOR) 10-40 MG tablet Take 1 tablet by mouth every morning. 90 tablet 3    atorvastatin (LIPITOR) 40 MG tablet TAKE 1 TABLET(40 MG) BY MOUTH DAILY 30 tablet 0   escitalopram (LEXAPRO) 20 MG tablet Take 1 tablet (20 mg total) by mouth daily. Needs appointment for further refills. 30 tablet 0   famciclovir (FAMVIR) 250 MG tablet Take 1 tablet (250 mg total) by mouth 2 (two) times daily. (Patient taking differently: Take 250 mg by mouth daily.) 180 tablet 3   metFORMIN (GLUCOPHAGE) 1000 MG tablet TAKE 1 TABLET(1000 MG) BY MOUTH DAILY WITH A MEAL 90 tablet 3   esomeprazole (NEXIUM) 40 MG capsule Take 1 capsule by mouth daily. 1/2 every other day (Patient not taking: Reported on 12/17/2021)     No facility-administered medications prior to visit.   Allergies  Allergen Reactions   Cola (Syrup) Anaphylaxis   Sulfa Antibiotics Hives and Rash     Objective:   Today's Vitals   12/17/21 1353  BP: 124/76  Pulse: 81  Temp: 97.8 F (36.6 C)  TempSrc: Temporal  SpO2: 91%  Weight: 213 lb 9.6 oz (96.9 kg)  Height: 5\' 4"  (1.626 m)   Body mass index is 36.66 kg/m.   General: Well developed, well nourished. No acute distress. Psych: Alert and oriented. Normal mood and affect.  Health Maintenance Due  Topic Date Due   OPHTHALMOLOGY EXAM  Never done   HIV Screening  Never done   Zoster Vaccines- Shingrix (1 of 2) Never done   Pneumonia Vaccine 4+ Years old (1 - PCV) Never done   DEXA SCAN  Never done   Diabetic kidney evaluation - GFR measurement  08/23/2021   Diabetic kidney evaluation - Urine ACR  08/23/2021   HEMOGLOBIN A1C  09/04/2021     Assessment & Plan:   1. Type 2 diabetes mellitus without complication, without long-term current use of insulin (HCC) We discussed her options for ongoing management of her diabetes. I will check her annual DM labs toady. If her A1c remains above goal, I will plan to start a 2nd agent for DM, likely a GLP-1 RA, which may also aid her in weight loss.  - metFORMIN (GLUCOPHAGE) 1000 MG tablet; TAKE 1 TABLET(1000 MG) BY MOUTH  DAILY WITH A MEAL  Dispense: 90 tablet; Refill: 3 - Microalbumin / creatinine urine ratio - Basic metabolic panel - Hemoglobin A1c - Urinalysis, Routine w reflex microscopic  2. Essential hypertension Blood pressure is at goal. Continue Azor.  - amLODipine-olmesartan (AZOR) 10-40 MG tablet; Take 1 tablet by mouth every morning.  Dispense: 90 tablet; Refill: 3  3. Mixed hyperlipidemia We will check lipids today. Plan to continue atorvastatin.  - atorvastatin (LIPITOR) 40 MG tablet; Take 1 tablet (40 mg total) by mouth daily.  Dispense: 90 tablet; Refill: 3 - Lipid panel  4. Class 2 severe obesity due to excess calories with serious comorbidity and body mass index (BMI) of 35.0 to 35.9  in adult [facility]) Maximum weight: 220 lbs (11/2019) Current weight: 213 lbs Weight change since last visit: + 8 lbs Total weight loss: 7 lbs (3.2%)  Would consider addition of a GLP-1 RA for her diabetes and to help with weight loss.  5. GAD (generalized anxiety disorder) Stable on Lexapro.  - escitalopram (LEXAPRO) 20 MG tablet; Take 1 tablet (20 mg total) by mouth daily. Needs appointment for further refills.  Dispense: 90 tablet; Refill: 3  6. Genital herpes simplex, unspecified site Continue daily suppressive therapy.  - famciclovir (FAMVIR) 250 MG tablet; Take 1 tablet (250 mg total) by mouth daily.  Dispense: 90 tablet; Refill: 3   Return in about 3 months (around 03/19/2022) for Reassessment.   Loyola Mast, MD

## 2021-12-18 ENCOUNTER — Other Ambulatory Visit (HOSPITAL_COMMUNITY): Payer: Self-pay

## 2021-12-18 LAB — BASIC METABOLIC PANEL
BUN: 14 mg/dL (ref 6–23)
CO2: 24 mEq/L (ref 19–32)
Calcium: 9.3 mg/dL (ref 8.4–10.5)
Chloride: 102 mEq/L (ref 96–112)
Creatinine, Ser: 0.87 mg/dL (ref 0.40–1.20)
GFR: 69.9 mL/min (ref >60.00)
Glucose, Bld: 171 mg/dL — ABNORMAL HIGH (ref 70–99)
Potassium: 4.5 mEq/L (ref 3.5–5.1)
Sodium: 138 mEq/L (ref 135–145)

## 2021-12-18 LAB — LIPID PANEL
Cholesterol: 166 mg/dL (ref 0–200)
HDL: 58.9 mg/dL (ref >39.00)
LDL Cholesterol: 84 mg/dL (ref 0–99)
NonHDL: 107.25
Total CHOL/HDL Ratio: 3
Triglycerides: 118 mg/dL (ref 0.0–149.0)
VLDL: 23.6 mg/dL (ref 0.0–40.0)

## 2021-12-18 LAB — HEMOGLOBIN A1C: Hgb A1c MFr Bld: 10.3 % — ABNORMAL HIGH (ref 4.6–6.5)

## 2021-12-18 MED ORDER — TIRZEPATIDE 5 MG/0.5ML ~~LOC~~ SOAJ: 5.0000 mg | SUBCUTANEOUS | 3 refills | Status: DC

## 2021-12-18 MED ORDER — TIRZEPATIDE 2.5 MG/0.5ML ~~LOC~~ SOAJ: 2.5000 mg | SUBCUTANEOUS | 0 refills | Status: DC

## 2021-12-18 NOTE — Addendum Note (Signed)
Addended by: Haydee Salter on: 12/18/2021 11:41 AM   Modules accepted: Orders

## 2022-01-07 ENCOUNTER — Other Ambulatory Visit: Payer: Self-pay | Admitting: Surgery

## 2022-01-07 DIAGNOSIS — R928 Other abnormal and inconclusive findings on diagnostic imaging of breast: Secondary | ICD-10-CM

## 2022-01-09 ENCOUNTER — Other Ambulatory Visit: Payer: Self-pay | Admitting: Surgery

## 2022-01-09 DIAGNOSIS — R928 Other abnormal and inconclusive findings on diagnostic imaging of breast: Secondary | ICD-10-CM

## 2022-01-14 ENCOUNTER — Other Ambulatory Visit (HOSPITAL_COMMUNITY): Payer: Self-pay

## 2022-01-14 ENCOUNTER — Telehealth: Payer: Self-pay

## 2022-01-14 NOTE — Telephone Encounter (Signed)
Pharmacy Patient Advocate Encounter   Received notification from [facility] that prior authorization for Mounjaro '5MG'$ /0.5ML pen-injectors is required/requested.  Per Test Claim: Insurance will cover 30 day supply   PA not required. Called [facility] to run for 1 month supply. They received a paid claim.   Key [id number]

## 2022-01-15 ENCOUNTER — Telehealth (INDEPENDENT_AMBULATORY_CARE_PROVIDER_SITE_OTHER): Payer: No Typology Code available for payment source | Admitting: Family Medicine

## 2022-01-15 ENCOUNTER — Encounter: Payer: Self-pay | Admitting: Family Medicine

## 2022-01-15 VITALS — Ht 64.0 in | Wt 210.0 lb

## 2022-01-15 DIAGNOSIS — Z6835 Body mass index (BMI) 35.0-35.9, adult: Secondary | ICD-10-CM

## 2022-01-15 DIAGNOSIS — E119 Type 2 diabetes mellitus without complications: Secondary | ICD-10-CM

## 2022-01-15 LAB — HM DIABETES EYE EXAM

## 2022-01-15 NOTE — Progress Notes (Signed)
[facility] [address] [place] [address] Dept: [phone] Dept Fax: [phone]  Virtual Video Visit  I connected with [name] on 01/15/22 at 11:00 AM EST by a video enabled telemedicine application and verified that I am speaking with the correct person using two identifiers.  Location patient: Work Location provider: Clinic Persons participating in the virtual visit: Patient, Provider  I discussed the limitations of evaluation and management by telemedicine and the availability of in person appointments. The patient expressed understanding and agreed to proceed.  Chief Complaint  Patient presents with   Follow-up    F/u monjaro.  having some swelling    SUBJECTIVE:  HPI: [name] is a 66 y.o. female who is following up on initiation of tirzepatide [name]). We started this about a month ago.  [name] has a history of Type 2 diabetes. She has been managed on metformin 1,000 mg daily. Her A1c had been 12.8 in Feb. At her last visit, it had decreased to 10.3. In light of her continuing to be well above goal, I had recommended adding a GLP-1 RA. She has struggled to accept that she has not been able to manage this without medication. She is reconciling herself to the fact that she needed additional medication. Since starting the tirzepatide, she has noted nausea and diarrhea issues. This did improve somewhat when she moved her injection site form her abdomen to her thigh. She just took her 4th dose of the 2.5 mg weekly dose. She will step up to the 5 mg dose next week. She is finding she cannot eat pizza or fried foods, as she vomits afterwards. She denies any associated back pain.  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   Depressive disorder 07/04/2012   History of endometrial cancer 05/18/2012   Hyperlipidemia 09/25/2011   GAD (generalized anxiety disorder) 07/31/2006   Genital herpes 06/16/2006   Essential hypertension 06/16/2006   Past Surgical History:  Procedure Laterality Date   APPENDECTOMY     BREAST BIOPSY Right 12/10/2021   [place] RT BREAST BX W LOC DEV 1ST LESION IMG BX SPEC US GUIDE 12/10/2021 [facility]   BTL     HIATAL HERNIA REPAIR  12/1999   ROBOTIC ASSISTED TOTAL HYSTERECTOMY WITH BILATERAL SALPINGO OOPHERECTOMY Bilateral 06/23/2012   Procedure: ROBOTIC ASSISTED TOTAL HYSTERECTOMY WITH BILATERAL SALPINGO OOPHORECTOMY  LYMPH NODES, POSSIBLE LAPAROTOMY ;  Surgeon: Imagene Gurney A. Alycia Rossetti, MD;  Location: [facility];  Service: Gynecology;  Laterality: Bilateral;   TUBAL LIGATION     Family History  Problem Relation Age of Onset   Stroke Mother    Hypertension Mother    Dementia Mother    Heart disease Father    Diabetes Father    Hypertension Father    Hypertension Sister    Cancer Brother        Melanoma   Hypertension Brother    Cancer Paternal Aunt    Cancer Maternal Grandmother    Cancer Paternal Grandmother    Social History  Tobacco Use   Smoking status: Never   Smokeless tobacco: Never  Vaping Use   Vaping Use: Never used  Substance Use Topics   Alcohol use: Yes    Alcohol/week: 1.0 standard drink of alcohol    Types: 1 Glasses of wine per week    Comment: occas   Drug use: No    Current Outpatient Medications:    ALPRAZolam (XANAX) 0.5 MG tablet, Take 0.5 tablets (0.25 mg total) by mouth daily as needed for sleep or anxiety. 1/2 tab to 1 tab, Disp: 30 tablet, Rfl: 2   amLODipine-olmesartan  (AZOR) 10-40 MG tablet, Take 1 tablet by mouth every morning., Disp: 90 tablet, Rfl: 3   atorvastatin (LIPITOR) 40 MG tablet, Take 1 tablet (40 mg total) by mouth daily., Disp: 90 tablet, Rfl: 3   escitalopram (LEXAPRO) 20 MG tablet, Take 1 tablet (20 mg total) by mouth daily. Needs appointment for further refills., Disp: 90 tablet, Rfl: 3   famciclovir (FAMVIR) 250 MG tablet, Take 1 tablet (250 mg total) by mouth daily., Disp: 90 tablet, Rfl: 3   famotidine (PEPCID) 10 MG tablet, Take 10 mg by mouth daily., Disp: , Rfl:    metFORMIN (GLUCOPHAGE) 1000 MG tablet, TAKE 1 TABLET(1000 MG) BY MOUTH DAILY WITH A MEAL, Disp: 90 tablet, Rfl: 3   metoprolol succinate (TOPROL-XL) 100 MG 24 hr tablet, TAKE 1 TABLET BY MOUTH EVERY MORNING. TAKE WITH OR IMMEDIATELY FOLLOWING A MEAL, Disp: 90 tablet, Rfl: 0   tirzepatide (MOUNJARO) 2.5 MG/0.5ML Pen, Inject 2.5 mg into the skin once a week., Disp: 2 mL, Rfl: 0   tirzepatide (MOUNJARO) 5 MG/0.5ML Pen, Inject 5 mg into the skin once a week. Start after 1 month on the 2.5 mg weekly dose, Disp: 6 mL, Rfl: 3  Allergies  Allergen Reactions   Cola ([place]) Anaphylaxis   Sulfa Antibiotics Hives and Rash   ROS: See pertinent positives and negatives per HPI.  OBSERVATIONS/OBJECTIVE:  VITALS per patient if applicable: Today's Vitals   01/15/22 1052  Weight: 210 lb (95.3 kg)  Height: '5\' 4"'$  (1.626 m)   Body mass index is 36.05 kg/m.   GENERAL: Alert and oriented. Appears well and in no acute distress.  HEENT: Atraumatic. Eyes clear. No obvious abnormalities on inspection of external nose and ears.  NECK: Normal movements of the head and neck.  LUNGS: On inspection, no signs of respiratory distress. Breathing rate appears normal. No obvious gross SOB, gasping or wheezing, and no conversational dyspnea.  CV: No obvious cyanosis.  MS: Moves all visible extremities without noticeable abnormality.  PSYCH/NEURO: Pleasant and cooperative. No obvious depression  or anxiety. Speech and thought processing grossly intact.  ASSESSMENT AND PLAN: 1. Type 2 diabetes mellitus without complication, without long-term current use of insulin (HCC) 2. Class 2 severe obesity due to excess calories with serious comorbidity and body mass index (BMI) of 35.0 to 35.9 in adult [facility]) We will continue metformin 1,000 mg bid and tirzepatide 5 mg weekly. I will plant o see her back in 2 months for a repeat A1c. I did review medication side effects and risks. If she finds she is not tolerating the [facility], she will follow up with me sooner.  There are no diagnoses linked to this encounter.   I discussed the assessment and treatment plan with the patient. The patient was provided an opportunity to ask questions and all were answered. The patient agreed with the plan and demonstrated an understanding of the instructions.  The patient was advised to call back or seek an in-person evaluation if the symptoms worsen or if the condition fails to improve as anticipated.  Return in about 2 months (around 03/18/2022).   Haydee Salter, MD

## 2022-01-16 ENCOUNTER — Encounter: Payer: Self-pay | Admitting: Family Medicine

## 2022-01-25 ENCOUNTER — Other Ambulatory Visit: Payer: Self-pay

## 2022-01-25 ENCOUNTER — Encounter (HOSPITAL_BASED_OUTPATIENT_CLINIC_OR_DEPARTMENT_OTHER): Payer: Self-pay | Admitting: Surgery

## 2022-02-01 ENCOUNTER — Ambulatory Visit
Admission: RE | Admit: 2022-02-01 | Discharge: 2022-02-01 | Disposition: A | Discharge status: Home / Self Care | Payer: No Typology Code available for payment source | Source: Ambulatory Visit | Attending: Surgery | Admitting: Surgery

## 2022-02-01 ENCOUNTER — Encounter (HOSPITAL_BASED_OUTPATIENT_CLINIC_OR_DEPARTMENT_OTHER)
Admission: RE | Admit: 2022-02-01 | Discharge: 2022-02-01 | Disposition: A | Discharge status: Home / Self Care | Payer: No Typology Code available for payment source | Source: Ambulatory Visit | Attending: Surgery | Admitting: Surgery

## 2022-02-01 DIAGNOSIS — R928 Other abnormal and inconclusive findings on diagnostic imaging of breast: Secondary | ICD-10-CM

## 2022-02-01 DIAGNOSIS — Z803 Family history of malignant neoplasm of breast: Secondary | ICD-10-CM | POA: Diagnosis not present

## 2022-02-01 DIAGNOSIS — E119 Type 2 diabetes mellitus without complications: Secondary | ICD-10-CM | POA: Diagnosis not present

## 2022-02-01 DIAGNOSIS — N6011 Diffuse cystic mastopathy of right breast: Secondary | ICD-10-CM | POA: Diagnosis not present

## 2022-02-01 DIAGNOSIS — Z7984 Long term (current) use of oral hypoglycemic drugs: Secondary | ICD-10-CM | POA: Diagnosis not present

## 2022-02-01 DIAGNOSIS — Z6836 Body mass index (BMI) 36.0-36.9, adult: Secondary | ICD-10-CM | POA: Diagnosis not present

## 2022-02-01 DIAGNOSIS — K219 Gastro-esophageal reflux disease without esophagitis: Secondary | ICD-10-CM | POA: Diagnosis not present

## 2022-02-01 DIAGNOSIS — I10 Essential (primary) hypertension: Secondary | ICD-10-CM | POA: Diagnosis not present

## 2022-02-01 DIAGNOSIS — N6489 Other specified disorders of breast: Secondary | ICD-10-CM | POA: Diagnosis not present

## 2022-02-01 HISTORY — PX: BREAST BIOPSY: SHX20

## 2022-02-01 LAB — BASIC METABOLIC PANEL
Anion gap: 8 (ref 5–15)
BUN: 10 mg/dL (ref 8–23)
CO2: 28 mmol/L (ref 22–32)
Calcium: 9 mg/dL (ref 8.9–10.3)
Chloride: 102 mmol/L (ref 98–111)
Creatinine, Ser: 0.9 mg/dL (ref 0.44–1.00)
GFR, Estimated: >60 mL/min (ref >60)
Glucose, Bld: 162 mg/dL — ABNORMAL HIGH (ref 70–99)
Potassium: 4.2 mmol/L (ref 3.5–5.1)
Sodium: 138 mmol/L (ref 135–145)

## 2022-02-01 NOTE — Progress Notes (Signed)
Texted patient to remind them to coming in for Pre-procedure testing as well as to pick up soap and drink for day of surgery

## 2022-02-01 NOTE — Progress Notes (Signed)
Reviewed EKG with Dr. Lissa Hoard, okay to proceed with surgery.

## 2022-02-01 NOTE — Progress Notes (Signed)

## 2022-02-03 NOTE — H&P (Signed)
REFERRING PHYSICIAN: Herbie Drape, MD  PROVIDER: Wayne Both, MD  MRN: [id number] DOB: 1955/12/10  Subjective   Chief Complaint: New Consultation (Right Breast )   History of Present Illness: [name] is a 67 y.o. female who is seen  as an office consultation for evaluation of New Consultation (Right Breast ) .   This is a pleasant 67 year old female who was found to have an abnormality on screening mammography. She had follow-up films with the persistence of the abnormality. An ultrasound showed a heterogeneous area measuring 5.5 cm. This was 3 cm from the nipple in the right breast. Axilla was unremarkable. She underwent a biopsy which showed benign findings but this was felt to be discordant by radiology. She has had no previous problems regarding her breast. She denies nipple discharge. She has a family history of breast cancer in a paternal aunt that was later in life. She is otherwise without complaints. She has no cardiopulmonary issues  Review of Systems: A complete review of systems was obtained from the patient. I have reviewed this information and discussed as appropriate with the patient. See HPI as well for other ROS.  ROS   Medical History: Past Medical History:  Diagnosis Date  Anxiety  Arthritis  Diabetes mellitus without complication (CMS-HCC)  GERD (gastroesophageal reflux disease)  History of cancer  Hyperlipidemia  Hypertension   There is no problem list on file for this patient.  Past Surgical History:  Procedure Laterality Date  APPENDECTOMY 1968  LAPAROSCOPIC TUBAL LIGATION 1994  HYSTERECTOMY 2013    Allergies  Allergen Reactions  Cola (Syrup) Anaphylaxis  Sulfa (Sulfonamide Antibiotics) Hives and Rash   Current Outpatient Medications on File Prior to Visit  Medication Sig Dispense Refill  amLODIPine-olmesartan (AZOR) 10-40 mg tablet Take 1 tablet by mouth every morning  atorvastatin (LIPITOR) 40 MG tablet Take 40 mg by mouth once  daily  escitalopram oxalate (LEXAPRO) 20 MG tablet Take by mouth  famciclovir (FAMVIR) 250 MG tablet Take by mouth  metFORMIN (GLUCOPHAGE) 1000 MG tablet TAKE 1 TABLET(1000 MG) BY MOUTH DAILY WITH A MEAL  metoprolol succinate (TOPROL-XL) 100 MG XL tablet TAKE 1 TABLET BY MOUTH EVERY MORNING. TAKE WITH OR IMMEDIATELY FOLLOWING A MEAL  tirzepatide 2.5 mg/0.5 mL PnIj Inject subcutaneously  famotidine (PEPCID) 10 MG tablet Take 10 mg by mouth once daily   No current facility-administered medications on file prior to visit.   History reviewed. No pertinent family history.   Social History   Tobacco Use  Smoking Status Never  Smokeless Tobacco Never    Social History   Socioeconomic History  Marital status: Widowed  Tobacco Use  Smoking status: Never  Smokeless tobacco: Never  Substance and Sexual Activity  Alcohol use: Not Currently  Drug use: Never   Objective:   Vitals:  01/07/22 0920 01/07/22 0926  BP: 130/80  Pulse: 96  Temp: 36.2 C (97.1 F)  SpO2: 97%  Weight: 97.1 kg (214 lb)  Height: 165.1 cm (5\' 5" )  PainSc: 0-No pain   Body mass index is 35.61 kg/m.  Physical Exam   She appears well on exam  There is minimal bruising from the biopsy of the right breast. There are no palpable masses. The nipple areolar complex is normal  There is no axillary adenopathy  Labs, Imaging and Diagnostic Testing: I have reviewed her mammograms, ultrasound, and pathology results  Assessment and Plan:   Diagnoses and all orders for this visit:  Abnormal mammogram of right breast  We discussed the mammographic findings of the right breast as well as the biopsy results. Despite the biopsy being benign, radiology feels this is discordant and a lumpectomy of the right breast is recommended. I discussed the reasons for this with the patient. This is a suspicious enough appearing area that malignancy could be missed so removal was recommended. I discussed proceeding with a  radioactive seed guided right breast lumpectomy. We discussed the procedure in detail. We discussed the risks which includes but is not limited to bleeding, infection, the need for further surgery if malignancy is found, injury to surrounding structures, cardiopulmonary issues, postoperative recovery, etc. She understands and wishes to proceed with surgery. Surgery will be scheduled after the holidays at her request. I believe this is very reasonable.

## 2022-02-03 NOTE — Anesthesia Preprocedure Evaluation (Signed)
Anesthesia Evaluation  Patient identified by MRN, date of birth, ID band Patient awake    Reviewed: Allergy & Precautions, H&P , NPO status , Patient's Chart, lab work & pertinent test results  History of Anesthesia Complications (+) history of anesthetic complications  Airway Mallampati: III  TM Distance: >3 FB Neck ROM: Full    Dental no notable dental hx. (+) Teeth Intact, Dental Advisory Given   Pulmonary neg pulmonary ROS   Pulmonary exam normal breath sounds clear to auscultation       Cardiovascular Exercise Tolerance: Good hypertension, Pt. on medications and Pt. on home beta blockers Normal cardiovascular exam Rhythm:Regular Rate:Normal     Neuro/Psych  PSYCHIATRIC DISORDERS Anxiety Depression    negative neurological ROS     GI/Hepatic Neg liver ROS,GERD  Medicated,,  Endo/Other  diabetes, Type 2  Morbid obesity  Renal/GU Renal disease  negative genitourinary   Musculoskeletal negative musculoskeletal ROS (+)    Abdominal  (+) + obese  Peds negative pediatric ROS (+)  Hematology negative hematology ROS (+)   Anesthesia Other Findings   Reproductive/Obstetrics negative OB ROS                             Anesthesia Physical Anesthesia Plan  ASA: 3  Anesthesia Plan: General   Post-op Pain Management: Minimal or no pain anticipated   Induction: Intravenous  PONV Risk Score and Plan: 3 and Ondansetron, Dexamethasone and Treatment may vary due to age or medical condition  Airway Management Planned: LMA  Additional Equipment: None  Intra-op Plan:   Post-operative Plan: Extubation in OR  Informed Consent: I have reviewed the patients History and Physical, chart, labs and discussed the procedure including the risks, benefits and alternatives for the proposed anesthesia with the patient or authorized representative who has indicated his/her understanding and acceptance.        Plan Discussed with: CRNA and Anesthesiologist  Anesthesia Plan Comments: ( )       Anesthesia Quick Evaluation

## 2022-02-04 ENCOUNTER — Encounter (HOSPITAL_BASED_OUTPATIENT_CLINIC_OR_DEPARTMENT_OTHER)
Admission: RE | Disposition: A | Discharge status: Home / Self Care | Payer: Self-pay | Source: Home / Self Care | Attending: Surgery

## 2022-02-04 ENCOUNTER — Encounter (HOSPITAL_BASED_OUTPATIENT_CLINIC_OR_DEPARTMENT_OTHER): Payer: Self-pay | Admitting: Surgery

## 2022-02-04 ENCOUNTER — Ambulatory Visit (HOSPITAL_BASED_OUTPATIENT_CLINIC_OR_DEPARTMENT_OTHER)
Admission: RE | Admit: 2022-02-04 | Discharge: 2022-02-04 | Disposition: A | Discharge status: Home / Self Care | Payer: No Typology Code available for payment source | Attending: Surgery | Admitting: Surgery

## 2022-02-04 ENCOUNTER — Ambulatory Visit (HOSPITAL_BASED_OUTPATIENT_CLINIC_OR_DEPARTMENT_OTHER): Payer: No Typology Code available for payment source | Admitting: Anesthesiology

## 2022-02-04 ENCOUNTER — Other Ambulatory Visit: Payer: Self-pay

## 2022-02-04 ENCOUNTER — Ambulatory Visit
Admit: 2022-02-04 | Discharge: 2022-02-04 | Disposition: A | Discharge status: Home / Self Care | Payer: No Typology Code available for payment source | Attending: Surgery | Admitting: Surgery

## 2022-02-04 DIAGNOSIS — E119 Type 2 diabetes mellitus without complications: Secondary | ICD-10-CM | POA: Insufficient documentation

## 2022-02-04 DIAGNOSIS — R928 Other abnormal and inconclusive findings on diagnostic imaging of breast: Secondary | ICD-10-CM | POA: Diagnosis not present

## 2022-02-04 DIAGNOSIS — I10 Essential (primary) hypertension: Secondary | ICD-10-CM | POA: Insufficient documentation

## 2022-02-04 DIAGNOSIS — F418 Other specified anxiety disorders: Secondary | ICD-10-CM

## 2022-02-04 DIAGNOSIS — Z7984 Long term (current) use of oral hypoglycemic drugs: Secondary | ICD-10-CM | POA: Insufficient documentation

## 2022-02-04 DIAGNOSIS — N6011 Diffuse cystic mastopathy of right breast: Secondary | ICD-10-CM | POA: Insufficient documentation

## 2022-02-04 DIAGNOSIS — Z803 Family history of malignant neoplasm of breast: Secondary | ICD-10-CM | POA: Insufficient documentation

## 2022-02-04 DIAGNOSIS — N6489 Other specified disorders of breast: Secondary | ICD-10-CM | POA: Insufficient documentation

## 2022-02-04 DIAGNOSIS — Z6836 Body mass index (BMI) 36.0-36.9, adult: Secondary | ICD-10-CM

## 2022-02-04 DIAGNOSIS — K219 Gastro-esophageal reflux disease without esophagitis: Secondary | ICD-10-CM | POA: Insufficient documentation

## 2022-02-04 HISTORY — PX: BREAST LUMPECTOMY WITH RADIOACTIVE SEED LOCALIZATION: SHX6424

## 2022-02-04 LAB — GLUCOSE, CAPILLARY
Glucose-Capillary: 159 mg/dL — ABNORMAL HIGH (ref 70–99)
Glucose-Capillary: 166 mg/dL — ABNORMAL HIGH (ref 70–99)

## 2022-02-04 SURGERY — BREAST LUMPECTOMY WITH RADIOACTIVE SEED LOCALIZATION
Anesthesia: General | Site: Breast | Laterality: Right

## 2022-02-04 MED ORDER — ACETAMINOPHEN 500 MG PO TABS
ORAL_TABLET | ORAL | Status: AC
  Filled 2022-02-04: qty 2

## 2022-02-04 MED ORDER — ENSURE PRE-SURGERY PO LIQD: 296.0000 mL | Freq: Once | ORAL | Status: DC

## 2022-02-04 MED ORDER — ACETAMINOPHEN 160 MG/5ML PO SOLN: 325.0000 mg | ORAL | Status: DC | PRN

## 2022-02-04 MED ORDER — FENTANYL CITRATE (PF) 100 MCG/2ML IJ SOLN: 25.0000 ug | INTRAMUSCULAR | Status: DC | PRN

## 2022-02-04 MED ORDER — EPHEDRINE SULFATE (PRESSORS) 50 MG/ML IJ SOLN
INTRAMUSCULAR | Status: DC | PRN
  Administered 2022-02-04: 7 mg via INTRAVENOUS

## 2022-02-04 MED ORDER — TRAMADOL HCL 50 MG PO TABS: 50.0000 mg | ORAL_TABLET | Freq: Four times a day (QID) | ORAL | 0 refills | Status: DC | PRN

## 2022-02-04 MED ORDER — CEFAZOLIN SODIUM-DEXTROSE 2-4 GM/100ML-% IV SOLN
INTRAVENOUS | Status: AC
  Filled 2022-02-04: qty 100

## 2022-02-04 MED ORDER — MIDAZOLAM HCL 5 MG/5ML IJ SOLN
INTRAMUSCULAR | Status: DC | PRN
  Administered 2022-02-04 (×2): 1 mg via INTRAVENOUS

## 2022-02-04 MED ORDER — ONDANSETRON HCL 4 MG/2ML IJ SOLN
INTRAMUSCULAR | Status: AC
  Filled 2022-02-04: qty 2

## 2022-02-04 MED ORDER — LACTATED RINGERS IV SOLN: INTRAVENOUS | Status: DC

## 2022-02-04 MED ORDER — ACETAMINOPHEN 325 MG PO TABS: 325.0000 mg | ORAL_TABLET | ORAL | Status: DC | PRN

## 2022-02-04 MED ORDER — LIDOCAINE 2% (20 MG/ML) 5 ML SYRINGE
INTRAMUSCULAR | Status: AC
  Filled 2022-02-04: qty 5

## 2022-02-04 MED ORDER — CEFAZOLIN SODIUM-DEXTROSE 2-4 GM/100ML-% IV SOLN
2.0000 g | INTRAVENOUS | Status: AC
  Administered 2022-02-04: 2 g via INTRAVENOUS

## 2022-02-04 MED ORDER — MIDAZOLAM HCL 2 MG/2ML IJ SOLN
INTRAMUSCULAR | Status: AC
  Filled 2022-02-04: qty 2

## 2022-02-04 MED ORDER — OXYCODONE HCL 5 MG PO TABS: 5.0000 mg | ORAL_TABLET | Freq: Once | ORAL | Status: DC | PRN

## 2022-02-04 MED ORDER — MEPERIDINE HCL 25 MG/ML IJ SOLN: 6.2500 mg | INTRAMUSCULAR | Status: DC | PRN

## 2022-02-04 MED ORDER — ONDANSETRON HCL 4 MG/2ML IJ SOLN: 4.0000 mg | Freq: Once | INTRAMUSCULAR | Status: DC | PRN

## 2022-02-04 MED ORDER — CHLORHEXIDINE GLUCONATE CLOTH 2 % EX PADS: 6.0000 | MEDICATED_PAD | Freq: Once | CUTANEOUS | Status: DC

## 2022-02-04 MED ORDER — OXYCODONE HCL 5 MG/5ML PO SOLN: 5.0000 mg | Freq: Once | ORAL | Status: DC | PRN

## 2022-02-04 MED ORDER — FENTANYL CITRATE (PF) 100 MCG/2ML IJ SOLN
INTRAMUSCULAR | Status: AC
  Filled 2022-02-04: qty 2

## 2022-02-04 MED ORDER — BUPIVACAINE-EPINEPHRINE 0.5% -1:200000 IJ SOLN
INTRAMUSCULAR | Status: DC | PRN
  Administered 2022-02-04: 20 mL

## 2022-02-04 MED ORDER — DEXAMETHASONE SODIUM PHOSPHATE 10 MG/ML IJ SOLN
INTRAMUSCULAR | Status: AC
  Filled 2022-02-04: qty 1

## 2022-02-04 MED ORDER — BUPIVACAINE-EPINEPHRINE (PF) 0.5% -1:200000 IJ SOLN
INTRAMUSCULAR | Status: AC
  Filled 2022-02-04: qty 120

## 2022-02-04 MED ORDER — PROPOFOL 10 MG/ML IV BOLUS
INTRAVENOUS | Status: AC
  Filled 2022-02-04: qty 20

## 2022-02-04 MED ORDER — ONDANSETRON HCL 4 MG/2ML IJ SOLN
INTRAMUSCULAR | Status: DC | PRN
  Administered 2022-02-04: 4 mg via INTRAVENOUS

## 2022-02-04 MED ORDER — ACETAMINOPHEN 500 MG PO TABS
1000.0000 mg | ORAL_TABLET | ORAL | Status: AC
  Administered 2022-02-04: 1000 mg via ORAL

## 2022-02-04 MED ORDER — PROPOFOL 10 MG/ML IV BOLUS
INTRAVENOUS | Status: DC | PRN
  Administered 2022-02-04: 150 mg via INTRAVENOUS

## 2022-02-04 MED ORDER — FENTANYL CITRATE (PF) 100 MCG/2ML IJ SOLN
INTRAMUSCULAR | Status: DC | PRN
  Administered 2022-02-04: 50 ug via INTRAVENOUS

## 2022-02-04 MED ORDER — LIDOCAINE HCL (CARDIAC) PF 100 MG/5ML IV SOSY
PREFILLED_SYRINGE | INTRAVENOUS | Status: DC | PRN
  Administered 2022-02-04: 100 mg via INTRAVENOUS

## 2022-02-04 MED ORDER — DEXAMETHASONE SODIUM PHOSPHATE 10 MG/ML IJ SOLN
INTRAMUSCULAR | Status: DC | PRN
  Administered 2022-02-04: 4 mg via INTRAVENOUS

## 2022-02-04 SURGICAL SUPPLY — 47 items
ADH SKN CLS APL DERMABOND .7 (GAUZE/BANDAGES/DRESSINGS) ×1
APL PRP STRL LF DISP 70% ISPRP (MISCELLANEOUS) ×1
APPLIER CLIP 9.375 MED OPEN (MISCELLANEOUS)
APR CLP MED 9.3 20 MLT OPN (MISCELLANEOUS)
BINDER BREAST 3XL (GAUZE/BANDAGES/DRESSINGS) IMPLANT
BINDER BREAST LRG (GAUZE/BANDAGES/DRESSINGS) IMPLANT
BINDER BREAST MEDIUM (GAUZE/BANDAGES/DRESSINGS) IMPLANT
BINDER BREAST XLRG (GAUZE/BANDAGES/DRESSINGS) IMPLANT
BINDER BREAST XXLRG (GAUZE/BANDAGES/DRESSINGS) IMPLANT
BLADE SURG 15 STRL LF DISP TIS (BLADE) ×1 IMPLANT
BLADE SURG 15 STRL SS (BLADE) ×1
CANISTER SUC SOCK COL 7IN (MISCELLANEOUS) IMPLANT
CANISTER SUCT 1200ML W/VALVE (MISCELLANEOUS) IMPLANT
CHLORAPREP W/TINT 26 (MISCELLANEOUS) ×1 IMPLANT
CLIP APPLIE 9.375 MED OPEN (MISCELLANEOUS) IMPLANT
COVER BACK TABLE 60X90IN (DRAPES) ×1 IMPLANT
COVER MAYO STAND STRL (DRAPES) ×1 IMPLANT
COVER PROBE CYLINDRICAL 5X96 (MISCELLANEOUS) ×1 IMPLANT
DERMABOND ADVANCED .7 DNX12 (GAUZE/BANDAGES/DRESSINGS) ×1 IMPLANT
DRAPE LAPAROSCOPIC ABDOMINAL (DRAPES) ×1 IMPLANT
DRAPE UTILITY XL STRL (DRAPES) ×1 IMPLANT
ELECT REM PT RETURN 9FT ADLT (ELECTROSURGICAL) ×1
ELECTRODE REM PT RTRN 9FT ADLT (ELECTROSURGICAL) ×1 IMPLANT
GAUZE SPONGE 4X4 12PLY STRL LF (GAUZE/BANDAGES/DRESSINGS) IMPLANT
GLOVE SURG SIGNA 7.5 PF LTX (GLOVE) ×1 IMPLANT
GOWN STRL REUS W/ TWL LRG LVL3 (GOWN DISPOSABLE) ×1 IMPLANT
GOWN STRL REUS W/ TWL XL LVL3 (GOWN DISPOSABLE) ×1 IMPLANT
GOWN STRL REUS W/TWL LRG LVL3 (GOWN DISPOSABLE) ×1
GOWN STRL REUS W/TWL XL LVL3 (GOWN DISPOSABLE) ×1
KIT MARKER MARGIN INK (KITS) ×1 IMPLANT
NDL HYPO 25X1 1.5 SAFETY (NEEDLE) ×1 IMPLANT
NEEDLE HYPO 25X1 1.5 SAFETY (NEEDLE) ×1 IMPLANT
NS IRRIG 1000ML POUR BTL (IV SOLUTION) IMPLANT
PACK BASIN DAY SURGERY FS (CUSTOM PROCEDURE TRAY) ×1 IMPLANT
PENCIL SMOKE EVACUATOR (MISCELLANEOUS) ×1 IMPLANT
SLEEVE SCD COMPRESS KNEE MED (STOCKING) ×1 IMPLANT
SPIKE FLUID TRANSFER (MISCELLANEOUS) IMPLANT
SPONGE T-LAP 4X18 ~~LOC~~+RFID (SPONGE) ×1 IMPLANT
SUT MNCRL AB 4-0 PS2 18 (SUTURE) ×1 IMPLANT
SUT SILK 2 0 SH (SUTURE) IMPLANT
SUT VIC AB 3-0 SH 27 (SUTURE) ×1
SUT VIC AB 3-0 SH 27X BRD (SUTURE) ×1 IMPLANT
SYR CONTROL 10ML LL (SYRINGE) ×1 IMPLANT
TOWEL GREEN STERILE FF (TOWEL DISPOSABLE) ×1 IMPLANT
TRAY FAXITRON CT DISP (TRAY / TRAY PROCEDURE) ×1 IMPLANT
TUBE CONNECTING 20X1/4 (TUBING) IMPLANT
YANKAUER SUCT BULB TIP NO VENT (SUCTIONS) IMPLANT

## 2022-02-04 NOTE — Transfer of Care (Signed)
Immediate Anesthesia Transfer of Care Note  Patient: [name]  Procedure(s) Performed: RIGHT BREAST LUMPECTOMY WITH RADIOACTIVE SEED LOCALIZATION (Right: Breast)  Patient Location: PACU  Anesthesia Type:General  Level of Consciousness: awake, alert , oriented, and patient cooperative  Airway & Oxygen Therapy: Patient Spontanous Breathing and Patient connected to face mask oxygen  Post-op Assessment: Report given to RN and Post -op Vital signs reviewed and stable  Post vital signs: Reviewed and stable  Last Vitals:  Vitals Value Taken Time  BP 126/66 02/04/22 0837  Temp    Pulse 94 02/04/22 0838  Resp 16 02/04/22 0838  SpO2 99 % 02/04/22 0838  Vitals shown include unvalidated device data.  Last Pain:  Vitals:   02/04/22 0631  TempSrc: Oral  PainSc: 0-No pain      Patients Stated Pain Goal: 5 (63/33/54 5625)  Complications: No notable events documented.

## 2022-02-04 NOTE — Anesthesia Procedure Notes (Signed)
Procedure Name: LMA Insertion Date/Time: 02/04/2022 8:08 AM  Performed by: Garrel Ridgel, CRNAPre-anesthesia Checklist: Patient identified, Emergency Drugs available, Suction available and Patient being monitored Patient Re-evaluated:Patient Re-evaluated prior to induction Oxygen Delivery Method: Circle system utilized Preoxygenation: Pre-oxygenation with 100% oxygen Induction Type: IV induction Ventilation: Mask ventilation without difficulty LMA: LMA inserted LMA Size: 4.0 Tube type: Oral Number of attempts: 1 Placement Confirmation: positive ETCO2 and breath sounds checked- equal and bilateral Tube secured with: Tape Dental Injury: Teeth and Oropharynx as per pre-operative assessment

## 2022-02-04 NOTE — Discharge Instructions (Addendum)
Can take Tylenol after 12:40   [place] Office Phone Number [phone]  BREAST BIOPSY/ PARTIAL MASTECTOMY: POST OP INSTRUCTIONS  Always review your discharge instruction sheet given to you by the facility where your surgery was performed.  IF YOU HAVE DISABILITY OR FAMILY LEAVE FORMS, YOU MUST BRING THEM TO THE OFFICE FOR PROCESSING.  DO NOT GIVE THEM TO YOUR DOCTOR.  A prescription for pain medication may be given to you upon discharge.  Take your pain medication as prescribed, if needed.  If narcotic pain medicine is not needed, then you may take acetaminophen (Tylenol) or ibuprofen (Advil) as needed. Take your usually prescribed medications unless otherwise directed If you need a refill on your pain medication, please contact your pharmacy.  They will contact our office to request authorization.  Prescriptions will not be filled after 5pm or on week-ends. You should eat very light the first 24 hours after surgery, such as soup, crackers, pudding, etc.  Resume your normal diet the day after surgery. Most patients will experience some swelling and bruising in the breast.  Ice packs and a good support bra will help.  Swelling and bruising can take several days to resolve.  It is common to experience some constipation if taking pain medication after surgery.  Increasing fluid intake and taking a stool softener will usually help or prevent this problem from occurring.  A mild laxative (Milk of Magnesia or Miralax) should be taken according to package directions if there are no bowel movements after 48 hours. Unless discharge instructions indicate otherwise, you may remove your bandages 24-48 hours after surgery, and you may shower at that time.  You may have steri-strips (small skin tapes) in place directly over the incision.  These strips should be left on the skin for 7-10 days.  If your surgeon used skin glue on the incision, you may shower in 24 hours.  The glue will flake  off over the next 2-3 weeks.  Any sutures or staples will be removed at the office during your follow-up visit. ACTIVITIES:  You may resume regular daily activities (gradually increasing) beginning the next day.  Wearing a good support bra or sports bra minimizes pain and swelling.  You may have sexual intercourse when it is comfortable. You may drive when you no longer are taking prescription pain medication, you can comfortably wear a seatbelt, and you can safely maneuver your car and apply brakes. RETURN TO WORK:  ______________________________________________________________________________________ Dennis Bast should see your doctor in the office for a follow-up appointment approximately two weeks after your surgery.  Your doctor's nurse will typically make your follow-up appointment when she calls you with your pathology report.  Expect your pathology report 2-3 business days after your surgery.  You may call to check if you do not hear from [place] after three days. OTHER INSTRUCTIONS: YOU MAY SHOWER STARTING TOMORROW ICE PACK, TYLENOL, AND IBUPROFEN ALSO FOR PAIN NO VIGOROUS ACTIVITY FOR 1 WEEK _______________________________________________________________________________________________ _____________________________________________________________________________________________________________________________________ _____________________________________________________________________________________________________________________________________ _____________________________________________________________________________________________________________________________________  WHEN TO CALL YOUR DOCTOR: Fever over 101.0 Nausea and/or vomiting. Extreme swelling or bruising. Continued bleeding from incision. Increased pain, redness, or drainage from the incision.  The clinic staff is available to answer your questions during regular business hours.  Please don't hesitate to call and ask to speak to  one of the nurses for clinical concerns.  If you have a medical emergency, go to the nearest emergency room or call 911.  A surgeon from [facility] is always on call at the  hospital.  For further questions, please visit [email or web address]    Post Anesthesia Home Care Instructions  Activity: Get plenty of rest for the remainder of the day. A responsible individual must stay with you for 24 hours following the procedure.  For the next 24 hours, DO NOT: -Drive a car -Paediatric nurse -Drink alcoholic beverages -Take any medication unless instructed by your physician -Make any legal decisions or sign important papers.  Meals: Start with liquid foods such as gelatin or soup. Progress to regular foods as tolerated. Avoid greasy, spicy, heavy foods. If nausea and/or vomiting occur, drink only clear liquids until the nausea and/or vomiting subsides. Call your physician if vomiting continues.  Special Instructions/Symptoms: Your throat may feel dry or sore from the anesthesia or the breathing tube placed in your throat during surgery. If this causes discomfort, gargle with warm salt water. The discomfort should disappear within 24 hours.

## 2022-02-04 NOTE — Anesthesia Procedure Notes (Signed)
Procedure Name: LMA Insertion Date/Time: 02/04/2022 8:00 AM  Performed by: Garrel Ridgel, CRNAPre-anesthesia Checklist: Patient identified, Emergency Drugs available, Suction available and Patient being monitored Patient Re-evaluated:Patient Re-evaluated prior to induction Oxygen Delivery Method: Circle system utilized Preoxygenation: Pre-oxygenation with 100% oxygen Induction Type: IV induction Ventilation: Mask ventilation without difficulty LMA: LMA inserted LMA Size: 4.0 Tube type: Oral Number of attempts: 1 Placement Confirmation: positive ETCO2 and breath sounds checked- equal and bilateral Tube secured with: Tape Dental Injury: Teeth and Oropharynx as per pre-operative assessment

## 2022-02-04 NOTE — Anesthesia Postprocedure Evaluation (Signed)
Anesthesia Post Note  Patient: [name]  Procedure(s) Performed: RIGHT BREAST LUMPECTOMY WITH RADIOACTIVE SEED LOCALIZATION (Right: Breast)     Patient location during evaluation: PACU Anesthesia Type: General Level of consciousness: awake and alert Pain management: pain level controlled Vital Signs Assessment: post-procedure vital signs reviewed and stable Respiratory status: spontaneous breathing, nonlabored ventilation, respiratory function stable and patient connected to nasal cannula oxygen Cardiovascular status: blood pressure returned to baseline and stable Postop Assessment: no apparent nausea or vomiting Anesthetic complications: no  No notable events documented.  Last Vitals:  Vitals:   02/04/22 0900 02/04/22 0915  BP: 137/76 (!) 117/48  Pulse: 83 86  Resp: (!) 29 16  Temp:  36.6 C  SpO2: 94% 95%    Last Pain:  Vitals:   02/04/22 0915  TempSrc: Oral  PainSc: 0-No pain                 Wilver Tignor

## 2022-02-04 NOTE — Interval H&P Note (Signed)
History and Physical Interval Note:no change in H and P  02/04/2022 7:11 AM  [name]  has presented today for surgery, with the diagnosis of RIGHT BREAST ABNORMAL MAMMOGRAM.  The various methods of treatment have been discussed with the patient and family. After consideration of risks, benefits and other options for treatment, the patient has consented to  Procedure(s): RIGHT BREAST LUMPECTOMY WITH RADIOACTIVE SEED LOCALIZATION (Right) as a surgical intervention.  The patient's history has been reviewed, patient examined, no change in status, stable for surgery.  I have reviewed the patient's chart and labs.  Questions were answered to the patient's satisfaction.     Coralie Keens

## 2022-02-04 NOTE — Op Note (Signed)
RIGHT BREAST LUMPECTOMY WITH RADIOACTIVE SEED LOCALIZATION  Procedure Note  [name] 02/04/2022   Pre-op Diagnosis: RIGHT BREAST ABNORMAL MAMMOGRAM     Post-op Diagnosis: same  Procedure(s): RIGHT BREAST LUMPECTOMY WITH RADIOACTIVE SEED LOCALIZATION  Surgeon(s): Coralie Keens, MD  Anesthesia: General  Staff:  Circulator: Maurene Capes, RN Scrub Person: Izora Ribas, RN  Estimated Blood Loss: Minimal               Specimens: sent to path  Indications: This is a 67 year old female was found to have a 5 cm area that appears abnormal at the 10 o'clock position of the right breast on recent screening mammography.  A biopsy under stereotactic guidance was negative for malignancy.  This was felt by radiology to be discordant so surgical excision was recommended  Procedure: The patient was brought to the operating room identified as a correct patient.  She was placed upon the operating room table and general anesthesia was induced.  Her right breast was prepped and draped in the usual sterile fashion.  Using the neoprobe I located the radioactive seed approximately 3 cm from the nipple at the 10 o'clock position.  I anesthetized the lateral edge of the areola with Marcaine.  I then made a circumareolar incision with a scalpel.  I then dissected down to the breast tissue and then laterally toward the radioactive seed with the aid of the neoprobe.  I then performed a lumpectomy with the cautery staying widely around the radioactive seed with the aid of the neoprobe.  Once I completed the lumpectomy, I marked all margins with paint.  An x-ray was performed on the specimen confirmed that the radioactive seed and previous biopsy clip were in the specimen.  The specimen was then sent to pathology for evaluation.  I achieved hemostasis at the lumpectomy cavity with the cautery.  I anesthetized the incision and lumpectomy site further with Marcaine.  I closed the subcutaneous tissue  with interrupted 3-0 Vicryl sutures and closed the skin with a running 4-0 Monocryl.  Dermabond was then applied.  The patient tolerated the procedure well.  All the counts were correct at the end of the procedure.  The patient was then extubated in the operating room and taken in a stable condition to the recovery room.          Coralie Keens   Date: 02/04/2022  Time: 8:28 AM

## 2022-02-05 ENCOUNTER — Encounter (HOSPITAL_BASED_OUTPATIENT_CLINIC_OR_DEPARTMENT_OTHER): Payer: Self-pay | Admitting: Surgery

## 2022-02-05 LAB — SURGICAL PATHOLOGY

## 2022-02-05 NOTE — Progress Notes (Signed)
Left message stating courtesy call and if any questions or concerns please call the doctors office.  

## 2022-02-26 ENCOUNTER — Other Ambulatory Visit: Payer: Self-pay | Admitting: Family Medicine

## 2022-02-26 DIAGNOSIS — I10 Essential (primary) hypertension: Secondary | ICD-10-CM

## 2022-02-27 ENCOUNTER — Telehealth: Payer: Self-pay | Admitting: Family Medicine

## 2022-02-27 NOTE — Telephone Encounter (Signed)
Caller Name: Dillan Call back phone #: [phone]  MEDICATION(S):  tirzepatide Darcel Bayley) 5 MG/0.5ML Pen   Days of Med Remaining: last dose taken 02/14/2022, missed dose 02/21/2022, due for another dose 02/28/2022  Has the patient contacted their pharmacy (YES/NO)? Yes, pt has new insurance through [employer] and new prior Josem Kaufmann is required. She will upload new insurance to [employer] did pharmacy advise?   Preferred Pharmacy: [id number] - [place], [address] AT [facility] [address]

## 2022-02-27 NOTE — Telephone Encounter (Signed)
Printed out insurance information and tried to do in cover my meds but unable to do due to they can't find her.  Dm/cma

## 2022-03-04 ENCOUNTER — Other Ambulatory Visit (HOSPITAL_COMMUNITY): Payer: Self-pay

## 2022-03-04 NOTE — Telephone Encounter (Signed)
Pharmacy Patient Advocate Encounter   Received notification that prior authorization for Mounjaro '5MG'$ /0.5ML pen-injectors is required/requested.  Per Test Claim: Refill too soon, PA required   PA submitted on 03/04/22 to (ins) [employer] via [employer] [id number] Status is pending

## 2022-03-08 ENCOUNTER — Other Ambulatory Visit (HOSPITAL_COMMUNITY): Payer: Self-pay

## 2022-03-11 ENCOUNTER — Other Ambulatory Visit (HOSPITAL_COMMUNITY): Payer: Self-pay

## 2022-03-12 ENCOUNTER — Other Ambulatory Visit (HOSPITAL_COMMUNITY): Payer: Self-pay

## 2022-03-12 NOTE — Telephone Encounter (Signed)
PA cancelled. Refill too soon. Last filled 02/27/22

## 2022-03-19 ENCOUNTER — Encounter (HOSPITAL_COMMUNITY): Payer: Self-pay

## 2022-03-21 ENCOUNTER — Ambulatory Visit: Payer: No Typology Code available for payment source | Admitting: Family Medicine

## 2022-04-25 ENCOUNTER — Encounter: Payer: Self-pay | Admitting: Family Medicine

## 2022-04-25 ENCOUNTER — Ambulatory Visit: Payer: No Typology Code available for payment source | Admitting: Family Medicine

## 2022-04-25 VITALS — BP 124/82 | HR 88 | Temp 98.2°F | Ht 65.0 in | Wt 215.4 lb

## 2022-04-25 DIAGNOSIS — Z13820 Encounter for screening for osteoporosis: Secondary | ICD-10-CM

## 2022-04-25 DIAGNOSIS — E119 Type 2 diabetes mellitus without complications: Secondary | ICD-10-CM | POA: Diagnosis not present

## 2022-04-25 DIAGNOSIS — I10 Essential (primary) hypertension: Secondary | ICD-10-CM

## 2022-04-25 DIAGNOSIS — A6000 Herpesviral infection of urogenital system, unspecified: Secondary | ICD-10-CM | POA: Diagnosis not present

## 2022-04-25 DIAGNOSIS — E782 Mixed hyperlipidemia: Secondary | ICD-10-CM

## 2022-04-25 LAB — GLUCOSE, RANDOM: Glucose, Bld: 363 mg/dL — ABNORMAL HIGH (ref 70–99)

## 2022-04-25 LAB — HEMOGLOBIN A1C: Hgb A1c MFr Bld: 10.2 % — ABNORMAL HIGH (ref 4.6–6.5)

## 2022-04-25 MED ORDER — PIOGLITAZONE HCL 30 MG PO TABS: 30.0000 mg | ORAL_TABLET | Freq: Every day | ORAL | 3 refills | Status: DC

## 2022-04-25 MED ORDER — FAMCICLOVIR 250 MG PO TABS: 250.0000 mg | ORAL_TABLET | Freq: Two times a day (BID) | ORAL | 3 refills | Status: DC

## 2022-04-25 NOTE — Assessment & Plan Note (Signed)
Continue famcyclovir.

## 2022-04-25 NOTE — Assessment & Plan Note (Signed)
Continue atorvastatin 40 mg daily.  

## 2022-04-25 NOTE — Progress Notes (Signed)
[facility] [address] [place] [address] Dept: [phone] Dept Fax: [phone]  Chronic Care Office Visit  Subjective:    Patient ID: [name], female    DOB: Jun 15, 1955, 67 y.o..   MRN: [id number]  Chief Complaint  Patient presents with   Medical Management of Chronic Issues    4 month f/u DM.  Fasting today   stopped Monjaro in February due to side effects.    History of Present Illness:  Patient is in today for reassessment of chronic medical issues.  [name] has a history of Type 2 diabetes. She is currently managed on metformin 1,000 mg daily. We had tried adding tirzepatide to her regimen, but she was unable to tolerate the GI side effects of this.   [name] has a history of hypertension. She is managed on amlodipine-olmasartan (Azor) 10-40 mg daily. She also takes metoprolol 100 mg daily, but this is apparently more for control of frequent PVCs.   [name] has a history of hyperlipidemia. She is managed on atorvastatin 40 mg daily.   [name] has a history of genital herpes. She takes a famciclovir 250 mg bid for prevention, which has worked well.  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   Depressive disorder 07/04/2012   History of endometrial cancer 05/18/2012   Hyperlipidemia 09/25/2011   GAD (generalized anxiety disorder) 07/31/2006   Genital herpes 06/16/2006   Essential hypertension 06/16/2006   Past Surgical History:  Procedure Laterality Date   APPENDECTOMY     BREAST  BIOPSY Right 12/10/2021   [place] RT BREAST BX W LOC DEV 1ST LESION IMG BX SPEC US GUIDE 12/10/2021 [facility]   BREAST BIOPSY  02/01/2022   MM RT RADIOACTIVE SEED LOC MAMMO GUIDE 02/01/2022 [facility]   BREAST LUMPECTOMY WITH RADIOACTIVE SEED LOCALIZATION Right 02/04/2022   Procedure: RIGHT BREAST LUMPECTOMY WITH RADIOACTIVE SEED LOCALIZATION;  Surgeon: Abigail Miyamoto, MD;  Location: [facility];  Service: General;  Laterality: Right;   BTL     HIATAL HERNIA REPAIR  12/1999   ROBOTIC ASSISTED TOTAL HYSTERECTOMY WITH BILATERAL SALPINGO OOPHERECTOMY Bilateral 06/23/2012   Procedure: ROBOTIC ASSISTED TOTAL HYSTERECTOMY WITH BILATERAL SALPINGO OOPHORECTOMY  LYMPH NODES, POSSIBLE LAPAROTOMY ;  Surgeon: Rejeana Brock A. Duard Brady, MD;  Location: [facility];  Service: Gynecology;  Laterality: Bilateral;   TUBAL LIGATION     Family History  Problem Relation Age of Onset   Stroke Mother    Hypertension Mother    Dementia Mother    Heart disease Father    Diabetes Father    Hypertension Father    Hypertension Sister    Cancer Brother        Melanoma   Hypertension Brother    Cancer Paternal Aunt    Cancer Maternal Grandmother    Cancer Paternal Grandmother    Outpatient Medications Prior to Visit  Medication Sig Dispense Refill   ALPRAZolam (XANAX) 0.5 MG tablet Take 0.5 tablets (0.25 mg total) by mouth daily as needed for sleep or anxiety. 1/2 tab to 1  tab 30 tablet 2   amLODipine-olmesartan (AZOR) 10-40 MG tablet Take 1 tablet by mouth every morning. 90 tablet 3   atorvastatin (LIPITOR) 40 MG tablet Take 1 tablet (40 mg total) by mouth daily. 90 tablet 3   escitalopram (LEXAPRO) 20 MG tablet Take 1 tablet (20 mg total) by mouth daily. Needs appointment for further refills. 90 tablet 3   esomeprazole (NEXIUM) 20 MG capsule Take 20 mg by mouth daily at 12 noon.     metFORMIN (GLUCOPHAGE) 1000 MG tablet TAKE 1 TABLET(1000 MG) BY MOUTH DAILY WITH A MEAL 90 tablet 3   metoprolol  succinate (TOPROL-XL) 100 MG 24 hr tablet TAKE 1 TABLET BY MOUTH EVERY MORNING WITH OR IMMEDIATELY FOLLOWING A MEAL 90 tablet 0   Probiotic Product (ALIGN PO) Take by mouth.     famciclovir (FAMVIR) 250 MG tablet Take 1 tablet (250 mg total) by mouth daily. 90 tablet 3   famotidine (PEPCID) 10 MG tablet Take 10 mg by mouth daily. (Patient not taking: Reported on 04/25/2022)     tirzepatide [facility]) 5 MG/0.5ML Pen Inject 5 mg into the skin once a week. Start after 1 month on the 2.5 mg weekly dose 6 mL 3   traMADol (ULTRAM) 50 MG tablet Take 1 tablet (50 mg total) by mouth every 6 (six) hours as needed. 25 tablet 0   No facility-administered medications prior to visit.   Allergies  Allergen Reactions   Cola (Syrup) Anaphylaxis   Sulfa Antibiotics Hives and Rash   Objective:   Today's Vitals   04/25/22 1016  BP: 124/82  Pulse: 88  Temp: 98.2 F (36.8 C)  TempSrc: Temporal  SpO2: 94%  Weight: 215 lb 6.4 oz (97.7 kg)  Height: 5\' 5"  (1.651 m)   Body mass index is 35.84 kg/m.   General: Well developed, well nourished. No acute distress. Feet- Skin intact. No sign of maceration between toes. Nails are normal. Dorsalis pedis and posterior tibial artery pulses are normal. 5.07 monofilament testing normal. Psych: Alert and oriented. Normal mood and affect.  Health Maintenance Due  Topic Date Due   HIV Screening  Never done   Pneumonia Vaccine 109+ Years old (1 of 1 - PCV) Never done   DEXA SCAN  Never done     Assessment & Plan:   Problem List Items Addressed This Visit       Cardiovascular and Mediastinum   Essential hypertension - Primary    BP is adequately controlled. Continue Azor 10-40 mg daily.        Endocrine   Type 2 diabetes mellitus without complications (HCC)    We will reassess the A1c today. She did not tolerate tirzepatide and has not been able to tolerate increasing her metformin dose. I will add pioglitazone 30 mg daily.      Relevant Medications    pioglitazone (ACTOS) 30 MG tablet   Other Relevant Orders   Glucose, random   Hemoglobin A1c     Genitourinary   Genital herpes    Continue famcyclovir.      Relevant Medications   famciclovir (FAMVIR) 250 MG tablet     Other   Hyperlipidemia    Continue atorvastatin 40 mg daily.      Other Visit Diagnoses     Screening for osteoporosis       Relevant Orders   DG Bone Density      Patient will return for PCV-20 next week.  Return in about 3 months (around 07/26/2022)  for Reassessment.   Loyola Mast, MD

## 2022-04-25 NOTE — Assessment & Plan Note (Signed)
BP is adequately controlled. Continue Azor 10-40 mg daily.

## 2022-04-25 NOTE — Assessment & Plan Note (Signed)
We will reassess the A1c today. She did not tolerate tirzepatide and has not been able to tolerate increasing her metformin dose. I will add pioglitazone 30 mg daily.

## 2022-05-25 ENCOUNTER — Other Ambulatory Visit: Payer: Self-pay | Admitting: Family Medicine

## 2022-05-25 DIAGNOSIS — I10 Essential (primary) hypertension: Secondary | ICD-10-CM

## 2022-09-18 ENCOUNTER — Encounter: Payer: Self-pay | Admitting: Internal Medicine

## 2022-09-18 ENCOUNTER — Ambulatory Visit: Payer: No Typology Code available for payment source | Admitting: Internal Medicine

## 2022-09-18 VITALS — BP 124/86 | HR 78 | Temp 97.9°F | Ht 65.0 in | Wt 213.4 lb

## 2022-09-18 DIAGNOSIS — M545 Low back pain, unspecified: Secondary | ICD-10-CM | POA: Diagnosis not present

## 2022-09-18 DIAGNOSIS — D1771 Benign lipomatous neoplasm of kidney: Secondary | ICD-10-CM | POA: Diagnosis not present

## 2022-09-18 LAB — POC URINALSYSI DIPSTICK (AUTOMATED)
Bilirubin, UA: NEGATIVE
Blood, UA: NEGATIVE
Glucose, UA: POSITIVE — AB
Leukocytes, UA: NEGATIVE
Nitrite, UA: NEGATIVE
Protein, UA: POSITIVE — AB
Spec Grav, UA: 1.015 (ref 1.010–1.025)
Urobilinogen, UA: 0.2 E.U./dL
pH, UA: 5.5 (ref 5.0–8.0)

## 2022-09-18 MED ORDER — METHOCARBAMOL 500 MG PO TABS: 500.0000 mg | ORAL_TABLET | Freq: Three times a day (TID) | ORAL | 0 refills | Status: DC | PRN

## 2022-09-18 NOTE — Patient Instructions (Signed)
Alternate tylenol 650mg  every 6-8 hours as needed for pain, or take ibuprofen 800mg  every 8 hours as needed for pain. Rest, no heavy lifting if possible  Apply heat pad.

## 2022-09-18 NOTE — Progress Notes (Signed)
[facility] [address] [place] [address] Dept: [phone] Dept Fax: [phone]  Acute Care Office Visit  Subjective:   [name] [facility] 1955/08/29 09/18/2022  Chief Complaint  Patient presents with   Back Pain    Kidney area started several weeks ago    HPI: [name] is a 67 yo F who complains of right sided back pain onset several weeks ago. Pain described as an ache. Intensity varies with times pain is more severe than other times. Nothing worsens pain. No treatments tried for pain. Denies fever, chills, dysuria, hematuria, N/V, abdominal pain, recent back injury or falls.  Patient states that she does lift some heavy boxes at work, but cannot remember any specific incident that caused her back pain to start.  Today while patient was at work the pain did not alleviate and she could not get comfortable, so she came to PCP office for evaluation.   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  Last CT scan was in 2019 that did not show any changes in size.  She is concerned about the abnormality on her right kidney and if it is contributing to her right back pain.    The following portions of the patient's history were reviewed and updated as appropriate: past medical history, past surgical history, family history, social history, allergies, medications, and problem list.   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   Depressive disorder 07/04/2012   History of endometrial cancer 05/18/2012    Hyperlipidemia 09/25/2011   GAD (generalized anxiety disorder) 07/31/2006   Genital herpes 06/16/2006   Essential hypertension 06/16/2006   Past Medical History:  Diagnosis Date   Anxiety    Cancer (HCC)    endometrial   Complication of anesthesia    slow to awaken in past   Depression    Hypertension    Kidney, benign tumor checked every 5 years   right kidney spot   Urticaria    Past Surgical History:  Procedure Laterality Date   APPENDECTOMY     BREAST BIOPSY Right 12/10/2021   [place] RT BREAST BX W LOC DEV 1ST LESION IMG BX SPEC US GUIDE 12/10/2021 [facility]   BREAST BIOPSY  02/01/2022   MM RT RADIOACTIVE SEED LOC MAMMO GUIDE 02/01/2022 [facility]   BREAST LUMPECTOMY WITH RADIOACTIVE SEED LOCALIZATION Right 02/04/2022   Procedure: RIGHT BREAST LUMPECTOMY WITH RADIOACTIVE SEED LOCALIZATION;  Surgeon: Abigail Miyamoto, MD;  Location:  SURGERY CENTER;  Service: General;  Laterality: Right;   BTL     HIATAL HERNIA REPAIR  12/1999   ROBOTIC ASSISTED TOTAL HYSTERECTOMY WITH BILATERAL SALPINGO OOPHERECTOMY Bilateral 06/23/2012   Procedure: ROBOTIC ASSISTED TOTAL HYSTERECTOMY WITH BILATERAL SALPINGO OOPHORECTOMY  LYMPH NODES, POSSIBLE LAPAROTOMY ;  Surgeon: Rejeana Brock A. Duard Brady, MD;  Location: [facility];  Service: Gynecology;  Laterality: Bilateral;   TUBAL LIGATION     Family History  Problem Relation Age of Onset   Stroke Mother    Hypertension Mother    Dementia Mother    Heart disease Father  Diabetes Father    Hypertension Father    Hypertension Sister    Cancer Brother        Melanoma   Hypertension Brother    Cancer Paternal Aunt    Cancer Maternal Grandmother    Cancer Paternal Grandmother     Current Outpatient Medications:    ALPRAZolam (XANAX) 0.5 MG tablet, Take 0.5 tablets (0.25 mg total) by mouth daily as needed for sleep or anxiety. 1/2 tab to 1 tab, Disp: 30 tablet, Rfl: 2   amLODipine-olmesartan (AZOR) 10-40 MG tablet, Take 1 tablet by mouth  every morning., Disp: 90 tablet, Rfl: 3   atorvastatin (LIPITOR) 40 MG tablet, Take 1 tablet (40 mg total) by mouth daily., Disp: 90 tablet, Rfl: 3   escitalopram (LEXAPRO) 20 MG tablet, Take 1 tablet (20 mg total) by mouth daily. Needs appointment for further refills., Disp: 90 tablet, Rfl: 3   esomeprazole (NEXIUM) 20 MG capsule, Take 20 mg by mouth daily at 12 noon. Every other day, Disp: , Rfl:    famciclovir (FAMVIR) 250 MG tablet, Take 1 tablet (250 mg total) by mouth 2 (two) times daily., Disp: 180 tablet, Rfl: 3   metFORMIN (GLUCOPHAGE) 1000 MG tablet, TAKE 1 TABLET(1000 MG) BY MOUTH DAILY WITH A MEAL, Disp: 90 tablet, Rfl: 3   methocarbamol (ROBAXIN) 500 MG tablet, Take 1 tablet (500 mg total) by mouth every 8 (eight) hours as needed for muscle spasms., Disp: 30 tablet, Rfl: 0   metoprolol succinate (TOPROL-XL) 100 MG 24 hr tablet, TAKE 1 TABLET BY MOUTH EVERY MORNING WITH OR IMMEDIATELY FOLLOWING A MEAL, Disp: 90 tablet, Rfl: 3   pioglitazone (ACTOS) 30 MG tablet, Take 1 tablet (30 mg total) by mouth daily., Disp: 90 tablet, Rfl: 3   Probiotic Product (ALIGN PO), Take by mouth., Disp: , Rfl:  Allergies  Allergen Reactions   Cola (Syrup) Anaphylaxis   Sulfa Antibiotics Hives and Rash     ROS: A complete ROS was performed with pertinent positives/negatives noted in the HPI. The remainder of the ROS are negative.    Objective:   Today's Vitals   09/18/22 1052  BP: 124/86  Pulse: 78  Temp: 97.9 F (36.6 C)  TempSrc: Temporal  SpO2: 97%  Weight: 213 lb 6.4 oz (96.8 kg)  Height: 5\' 5"  (1.651 m)    GENERAL: Well-appearing, in NAD. Well nourished.  SKIN: Pink, warm and dry. No rash NECK: Trachea midline. Full ROM w/o pain or tenderness. No lymphadenopathy.  RESPIRATORY: Chest wall symmetrical. Respirations even and non-labored. Breath sounds clear to auscultation bilaterally.  CARDIAC: S1, S2 present, regular rate and rhythm. Peripheral pulses 2+ bilaterally.  MSK: Muscle tone  and strength appropriate for age.  Mild tenderness to right flank/right mid back.  EXTREMITIES: Without clubbing, cyanosis, or edema.  NEUROLOGIC:  Steady, even gait.  PSYCH/MENTAL STATUS: Alert, oriented x 3. Cooperative, appropriate mood and affect.    Results for orders placed or performed in visit on 09/18/22  POCT Urinalysis Dipstick (Automated)  Result Value Ref Range   Color, UA yellow    Clarity, UA clear    Glucose, UA Positive (A) Negative   Bilirubin, UA neg    Ketones, UA trace    Spec Grav, UA 1.015 1.010 - 1.025   Blood, UA neg    pH, UA 5.5 5.0 - 8.0   Protein, UA Positive (A) Negative   Urobilinogen, UA 0.2 0.2 or 1.0 E.U./dL   Nitrite, UA neg    Leukocytes, UA  Negative Negative      Assessment & Plan:  1. Acute right-sided low back pain without sciatica - methocarbamol (ROBAXIN) 500 MG tablet; Take 1 tablet (500 mg total) by mouth every 8 (eight) hours as needed for muscle spasms.  Dispense: 30 tablet; Refill: 0 -Advised patient to alternate Tylenol and ibuprofen every 6-8 hours as needed for pain. -Rest, avoid any heavy lifting -Use heating pad to affected area - POCT Urinalysis Dipstick (Automated)  2. Angiolipoma of right kidney - CT ABDOMEN PELVIS WO CONTRAST; Future -Will obtain repeat imaging on angiolipoma for further evaluation of any possible changes or further acute abnormalities causing patient's pain  Meds ordered this encounter  Medications   methocarbamol (ROBAXIN) 500 MG tablet    Sig: Take 1 tablet (500 mg total) by mouth every 8 (eight) hours as needed for muscle spasms.    Dispense:  30 tablet    Refill:  0    Order Specific Question:   Supervising Provider    Answer:   Garnette Gunner [[id number]]   Orders Placed This Encounter  Procedures   CT ABDOMEN PELVIS WO CONTRAST    Standing Status:   Future    Standing Expiration Date:   09/18/2023    Order Specific Question:   Preferred imaging location?    Answer:   [id number]     Order Specific Question:   If indicated for the ordered procedure, I authorize the administration of oral contrast media per Radiology protocol    Answer:   Yes    Order Specific Question:   Does the patient have a contrast media/X-ray dye allergy?    Answer:   No   POCT Urinalysis Dipstick (Automated)   Lab Orders         POCT Urinalysis Dipstick (Automated)     No images are attached to the encounter or orders placed in the encounter.  Return if symptoms worsen or fail to improve.   Salvatore Decent, FNP

## 2022-09-25 ENCOUNTER — Telehealth: Payer: Self-pay | Admitting: Family Medicine

## 2022-09-25 NOTE — Telephone Encounter (Signed)
Referral was faxed

## 2022-09-25 NOTE — Telephone Encounter (Signed)
Griffith Citron is calling from [facility] phone #[phone], she is needing a signed copy of pt's CT ABDOMEN PELVIS WO CONTRAST (Order [id number]) faxed over to [phone].

## 2022-10-28 ENCOUNTER — Encounter: Payer: Self-pay | Admitting: Internal Medicine

## 2022-10-28 DIAGNOSIS — K429 Umbilical hernia without obstruction or gangrene: Secondary | ICD-10-CM

## 2022-10-28 DIAGNOSIS — K439 Ventral hernia without obstruction or gangrene: Secondary | ICD-10-CM

## 2022-10-29 ENCOUNTER — Other Ambulatory Visit: Payer: Self-pay | Admitting: Internal Medicine

## 2022-10-29 DIAGNOSIS — N2889 Other specified disorders of kidney and ureter: Secondary | ICD-10-CM

## 2022-10-29 DIAGNOSIS — N289 Disorder of kidney and ureter, unspecified: Secondary | ICD-10-CM

## 2022-10-29 DIAGNOSIS — K439 Ventral hernia without obstruction or gangrene: Secondary | ICD-10-CM | POA: Insufficient documentation

## 2022-10-29 DIAGNOSIS — R911 Solitary pulmonary nodule: Secondary | ICD-10-CM

## 2022-10-29 DIAGNOSIS — K429 Umbilical hernia without obstruction or gangrene: Secondary | ICD-10-CM | POA: Insufficient documentation

## 2022-10-30 ENCOUNTER — Telehealth: Payer: Self-pay | Admitting: Family Medicine

## 2022-10-30 NOTE — Telephone Encounter (Signed)
Forgot to route

## 2022-10-30 NOTE — Telephone Encounter (Signed)
[facility] [phone] Fax [phone]  They need  a signed copy for the chest xray, Ct and MRI of kidney. They will be faxing the form.

## 2022-10-31 ENCOUNTER — Telehealth: Payer: Self-pay | Admitting: Family Medicine

## 2022-10-31 NOTE — Telephone Encounter (Signed)
Faxed to [phone] and went through at 8:28 am.  Dm/cma

## 2022-10-31 NOTE — Telephone Encounter (Signed)
Error.  Still waiting on provider to return then will fax documents to them. Dm/cma

## 2022-10-31 NOTE — Telephone Encounter (Signed)
[facility] called and need a signed copy of mri and ct scan of chest .  There # is [phone] and the fax is [phone]

## 2022-10-31 NOTE — Telephone Encounter (Signed)
Order printed and a will have her sign upon her coming back to the office. Dm/cma

## 2022-11-01 NOTE — Telephone Encounter (Signed)
[facility] called today again about the pt mri and ct scan

## 2022-11-04 NOTE — Telephone Encounter (Signed)
Faxed the signed MRI and Chest CT scan results.

## 2022-11-06 ENCOUNTER — Telehealth: Payer: Self-pay | Admitting: Family Medicine

## 2022-11-06 NOTE — Telephone Encounter (Signed)
Orders refaxed to [phone].  Dm/cma

## 2022-11-06 NOTE — Telephone Encounter (Signed)
Pt said  chest ct scan  abdomin or mri  was not signed and need  to be signed So she need them signed and refaxed back over. [phone] and fax is [phone] beth called but anyone can help

## 2022-11-11 ENCOUNTER — Telehealth: Payer: Self-pay | Admitting: Family Medicine

## 2022-11-11 NOTE — Telephone Encounter (Signed)
[facility] [phone]  Requesting a copy of the CT scan order.

## 2022-11-11 NOTE — Telephone Encounter (Signed)
I did not find this in a referral but is the information they gave me.

## 2022-11-11 NOTE — Telephone Encounter (Signed)
Called the number and got the fax number and [phone].  Faxed over the CT order and the MRI order to them. Dm/cma

## 2022-11-13 NOTE — Telephone Encounter (Signed)
I don't know exactly I Googled it and it says it's an imaging place. That is how they identify when I answer the phone.

## 2022-11-13 NOTE — Telephone Encounter (Signed)
[facility] is saying they did not receive the CT of her chest. I see that it was faxed but they still say they did not receive it.  Ph# [phone] Fax# [phone]

## 2022-11-14 NOTE — Telephone Encounter (Signed)
This was faxed numerous time to them.  Dm/cma

## 2022-11-15 NOTE — Telephone Encounter (Signed)
Re-faxed the CT chest to [phone].  Dm/cma

## 2022-11-19 NOTE — Telephone Encounter (Signed)
CT chest & MRI have been faxed to both [facility]  [phone] & [facility] [phone].  Dm/cma

## 2022-12-14 ENCOUNTER — Other Ambulatory Visit: Payer: Self-pay | Admitting: Family Medicine

## 2022-12-14 DIAGNOSIS — I10 Essential (primary) hypertension: Secondary | ICD-10-CM

## 2022-12-14 DIAGNOSIS — F411 Generalized anxiety disorder: Secondary | ICD-10-CM

## 2022-12-14 DIAGNOSIS — E782 Mixed hyperlipidemia: Secondary | ICD-10-CM

## 2022-12-20 ENCOUNTER — Other Ambulatory Visit: Payer: Self-pay | Admitting: Family Medicine

## 2022-12-20 DIAGNOSIS — E119 Type 2 diabetes mellitus without complications: Secondary | ICD-10-CM

## 2022-12-24 ENCOUNTER — Telehealth: Payer: No Typology Code available for payment source | Admitting: Internal Medicine

## 2022-12-24 DIAGNOSIS — K439 Ventral hernia without obstruction or gangrene: Secondary | ICD-10-CM | POA: Diagnosis not present

## 2022-12-24 DIAGNOSIS — R911 Solitary pulmonary nodule: Secondary | ICD-10-CM

## 2022-12-24 DIAGNOSIS — Q444 Choledochal cyst: Secondary | ICD-10-CM | POA: Insufficient documentation

## 2022-12-24 DIAGNOSIS — N281 Cyst of kidney, acquired: Secondary | ICD-10-CM

## 2022-12-24 NOTE — Progress Notes (Signed)
[facility]IMARY CARE [facility] [address] [place] [address] Dept: [phone] Dept Fax: [phone]  Virtual Video Visit  I connected with [name] on 12/24/22 at 10:20 AM EST by a video enabled telemedicine application and verified that I am speaking with the correct person using two identifiers.   Location patient: Home Location provider: Clinic Total time: 15 minutes Persons participating in the virtual visit: Patient; [name] CMA; Salvatore Decent, FNP-C  I discussed the limitations of evaluation and management by telemedicine and the availability of in-person appointments. The patient expressed understanding and agreed to proceed.  Chief Complaint  Patient presents with   Review Imaging results    SUBJECTIVE:  HPI:  [name] presents to discuss recent CT chest and MRI abdomen results. See below.   Impression  1.  Similar juxtapleural solid pulmonary nodule within the right lung base dating back to 2019, does not require any further specific follow-up. 2.  Moderate coronary artery calcifications. Narrative  CT CHEST WO CONTRAST, 12/17/2022 8:54 AM  INDICATION:Solitary pulmonary nodule \ R91.1 Solitary pulmonary nodule  ADDITIONAL HISTORY: None.  COMPARISON: CT abdomen pelvis 10/22/2022; CT abdomen pelvis 10/21/2017  TECHNIQUE: Multislice axial images were obtained through the chest without administration of iodinated intravenous contrast material. Multi-planar reformatted images were generated for additional analysis. Nongated technique limits cardiac detail.  All CT scans at [facility] and [facility] are performed using dose optimization techniques as appropriate to a performed exam, including but not limited to one or more of the following: automated exposure control, adjustment of the mA and/or kV according to patient size, use of iterative reconstruction  technique. In addition, [facility] is participating in the ACR Dose Registry program which will further assist [place] in optimizing patient radiation exposure.  FINDINGS:  Thoracic inlet/central airways: Thyroid normal. Airway patent. Upper abdomen: Refer to same day body MRI Chest wall/MSK: Within normal limits. Mediastinum/hila/axilla: No adenopathy. Esophagus is within normal limits. Heart/vessels: Normal heart size. No pericardial effusion. Aorta normal in caliber. There is moderate coronary artery calcification. Lungs/pleura: No pulmonary parenchymal disease, pleural effusion, or pneumothorax. Similar solid juxtapleural nodule in the right lower lobe (series 3, image 157) measuring up to 5.5 mm.    Impression  CONCLUSION: 1.  Right anterior interpolar renal lesion compatible with a hemorrhagic cyst with thin enhancing septation. No worrisome nodular enhancement. 2.  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 No internal nodularity or enhancement. Recommend endoscopic evaluation and/or surgical referral if clinically indicated. Narrative  MRI ABDOMEN W AND WO CONTRAST, 12/17/2022 9:28 AM  INDICATION: Disorder of kidney and ureter, unspecified \ N28.9 Disorder of kidney and ureter, unspecified ADDITIONAL HISTORY: None. COMPARISON: CT abdomen and pelvis 10/22/2022.  TECHNIQUE: Multi-planar, multi-sequence MR images of the abdomen were obtained prior to and after intravenous administration of gadolinium-based contrast.  FINDINGS:  . Lower Chest: Similar 5 mm right perifissural nodule.  .  Liver: Geographic pattern of hepatic steatosis in the right hepatic lobe. .  Gallbladder/Biliary: Cholelithiasis. Focal dilation of the distal common bile duct measuring up to 1.6 cm. .  Spleen: Within normal limits. .  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 No dilation of the main pancreatic duct. .  Adrenals: Within normal  limits. .  Kidneys: - 1.0 cm lesion which is mildly T1 hyperintense and mildly T2 hypointense. Thin enhancing septation without nodular enhancing component. - Similar right  interpolar subcentimeter AML. - Simple left lower pole cyst.  .  Peritoneum/Mesenteries/Extraperitoneum: No significant ascites or lymphadenopathy. .  Gastrointestinal tract: No evidence of obstruction. Scattered colonic diverticula without evidence of acute diverticulitis.  .  Vascular: Within normal limits. .  Musculoskeletal: Similar size of fat containing supraumbilical ventral abdominal wall hernia with fascial defect measuring 2.8 cm in diameter and hernia measuring up to 9.6 x 4.2 cm. No acute fracture or aggressive osseous lesion. Polyarticular degenerative changes.   The following portions of the patient's history were reviewed and updated as appropriate: medical history, surgical history, medications, allergies, social history, and family history.    Past Medical History:  Diagnosis Date   Anxiety    Cancer (HCC)    endometrial   Complication of anesthesia    slow to awaken in past   Depression    Hypertension    Kidney, benign tumor checked every 5 years   right kidney spot   Urticaria    Past Surgical History:  Procedure Laterality Date   APPENDECTOMY     BREAST BIOPSY Right 12/10/2021   [place] RT BREAST BX W LOC DEV 1ST LESION IMG BX SPEC US GUIDE 12/10/2021 [facility]   BREAST BIOPSY  02/01/2022   MM RT RADIOACTIVE SEED LOC MAMMO GUIDE 02/01/2022 [facility]   BREAST LUMPECTOMY WITH RADIOACTIVE SEED LOCALIZATION Right 02/04/2022   Procedure: RIGHT BREAST LUMPECTOMY WITH RADIOACTIVE SEED LOCALIZATION;  Surgeon: Abigail Miyamoto, MD;  Location: [facility];  Service: General;  Laterality: Right;   BTL     HIATAL HERNIA REPAIR  12/1999   ROBOTIC ASSISTED TOTAL HYSTERECTOMY WITH BILATERAL SALPINGO OOPHERECTOMY Bilateral 06/23/2012   Procedure: ROBOTIC ASSISTED TOTAL  HYSTERECTOMY WITH BILATERAL SALPINGO OOPHORECTOMY  LYMPH NODES, POSSIBLE LAPAROTOMY ;  Surgeon: Rejeana Brock A. Duard Brady, MD;  Location: [facility];  Service: Gynecology;  Laterality: Bilateral;   TUBAL LIGATION       Current Outpatient Medications:    ALPRAZolam (XANAX) 0.5 MG tablet, Take 0.5 tablets (0.25 mg total) by mouth daily as needed for sleep or anxiety. 1/2 tab to 1 tab, Disp: 30 tablet, Rfl: 2   amLODipine-olmesartan (AZOR) 10-40 MG tablet, TAKE 1 TABLET BY MOUTH EVERY MORNING, Disp: 90 tablet, Rfl: 3   atorvastatin (LIPITOR) 40 MG tablet, TAKE 1 TABLET(40 MG) BY MOUTH DAILY, Disp: 90 tablet, Rfl: 3   escitalopram (LEXAPRO) 20 MG tablet, TAKE 1 TABLET(20 MG) BY MOUTH DAILY, Disp: 90 tablet, Rfl: 3   famciclovir (FAMVIR) 250 MG tablet, Take 1 tablet (250 mg total) by mouth 2 (two) times daily., Disp: 180 tablet, Rfl: 3   metFORMIN (GLUCOPHAGE) 1000 MG tablet, TAKE 1 TABLET(1000 MG) BY MOUTH DAILY WITH A MEAL, Disp: 90 tablet, Rfl: 3   metoprolol succinate (TOPROL-XL) 100 MG 24 hr tablet, TAKE 1 TABLET BY MOUTH EVERY MORNING WITH OR IMMEDIATELY FOLLOWING A MEAL, Disp: 90 tablet, Rfl: 3   pioglitazone (ACTOS) 30 MG tablet, Take 1 tablet (30 mg total) by mouth daily., Disp: 90 tablet, Rfl: 3   esomeprazole (NEXIUM) 20 MG capsule, Take 20 mg by mouth daily at 12 noon. Every other day, Disp: , Rfl:    methocarbamol (ROBAXIN) 500 MG tablet, Take 1 tablet (500 mg total) by mouth every 8 (eight) hours as needed for muscle spasms., Disp: 30 tablet, Rfl: 0   Probiotic Product (ALIGN PO), Take by mouth., Disp: , Rfl:  Allergies  Allergen Reactions   Cola (Syrup) Anaphylaxis   Sulfa Antibiotics Hives and Rash  Social History   Socioeconomic History   Marital status: Widowed    Spouse name: Not on file   Number of children: 2   Years of education: Not on file   Highest education level: Bachelor's degree (e.g., BA, AB, BS)  Occupational History   Occupation: Futures trader    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[employer])  Tobacco Use   Smoking status: Never   Smokeless tobacco: Never  Vaping Use   Vaping status: Never Used  Substance and Sexual Activity   Alcohol use: Yes    Alcohol/week: 1.0 standard drink of alcohol    Types: 1 Glasses of wine per week    Comment: occas   Drug use: No   Sexual activity: Never  Other Topics Concern   Not on file  Social History Narrative   Not on file   Social Determinants of Health   Financial Resource Strain: Low Risk  (04/24/2022)   Overall Financial Resource Strain (CARDIA)    Difficulty of Paying Living Expenses: Not very hard  Food Insecurity: No Food Insecurity (04/24/2022)   Hunger Vital Sign    Worried About Running Out of Food in the Last Year: Never true    Ran Out of Food in the Last Year: Never true  Transportation Needs: No Transportation Needs (04/24/2022)   PRAPARE - Administrator, Civil Service (Medical): No    Lack of Transportation (Non-Medical): No  Physical Activity: Unknown (04/24/2022)   Exercise Vital Sign    Days of Exercise per Week: 0 days    Minutes of Exercise per Session: Not on file  Stress: No Stress Concern Present (04/24/2022)   [employer] of Occupational Health - Occupational Stress Questionnaire    Feeling of Stress : Only a little  Social Connections: Socially Isolated (04/24/2022)   Social Connection and Isolation Panel [NHANES]    Frequency of Communication with Friends and Family: More than three times a week    Frequency of Social Gatherings with Friends and Family: Patient declined    Attends Religious Services: Never    Database administrator or Organizations: No    Attends Engineer, structural: Not on file    Marital Status: Widowed  Catering manager Violence: Not on file    Family History  Problem Relation Age of Onset   Stroke Mother    Hypertension Mother    Dementia Mother    Heart disease Father    Diabetes Father    Hypertension Father     Hypertension Sister    Cancer Brother        Melanoma   Hypertension Brother    Cancer Paternal Aunt    Cancer Maternal Grandmother    Cancer Paternal Grandmother      ROS: A complete ROS was performed with pertinent positives/negatives noted in the HPI. The remainder of the ROS are negative.    OBJECTIVE:  VITALS per patient if applicable: There were no vitals filed for this visit. There is no height or weight on file to calculate BMI.   GENERAL: Alert and oriented. Appears well and in no acute distress. HEENT: Atraumatic. Conjunctiva clear. No obvious abnormalities on inspection of external nose and ears. NECK: Normal movements of the head and neck. LUNGS: On inspection, no signs of respiratory distress. Breathing rate appears normal. No obvious gross SOB, gasping or wheezing, and no conversational dyspnea. CV: No obvious cyanosis. MS: Moves all visible extremities without noticeable abnormality. PSYCH/NEURO: Pleasant and cooperative. No  obvious depression or anxiety. Speech and thought processing grossly intact.  ASSESSMENT AND PLAN: 1. Right lower lobe pulmonary nodule - stable , no further follow up required at this time   2. Renal cyst, acquired, right - stable, no further evaluation required at this time  3. Choledochal cyst - stable, no further evaluation at this time  4. Ventral hernia without obstruction or gangrene - patient is following up with general surgery in January to discuss hernia repair    I discussed the assessment and treatment plan with the patient. The patient was provided an opportunity to ask questions and all were answered. The patient agreed with the plan and demonstrated an understanding of the instructions.   The patient was advised to call back or seek an in-person evaluation if the symptoms worsen or if the condition fails to improve as anticipated.  Return for Scheduled Routine Office Visits and as needed.  Salvatore Decent, FNP

## 2023-01-07 ENCOUNTER — Ambulatory Visit: Payer: No Typology Code available for payment source | Admitting: Family Medicine

## 2023-01-07 VITALS — BP 124/80 | HR 85 | Temp 97.2°F | Ht 65.0 in | Wt 214.8 lb

## 2023-01-07 DIAGNOSIS — E119 Type 2 diabetes mellitus without complications: Secondary | ICD-10-CM

## 2023-01-07 DIAGNOSIS — I10 Essential (primary) hypertension: Secondary | ICD-10-CM | POA: Diagnosis not present

## 2023-01-07 DIAGNOSIS — Z1211 Encounter for screening for malignant neoplasm of colon: Secondary | ICD-10-CM

## 2023-01-07 DIAGNOSIS — E559 Vitamin D deficiency, unspecified: Secondary | ICD-10-CM

## 2023-01-07 DIAGNOSIS — E782 Mixed hyperlipidemia: Secondary | ICD-10-CM

## 2023-01-07 DIAGNOSIS — Z7984 Long term (current) use of oral hypoglycemic drugs: Secondary | ICD-10-CM | POA: Diagnosis not present

## 2023-01-07 LAB — BASIC METABOLIC PANEL
BUN: 11 mg/dL (ref 6–23)
CO2: 30 meq/L (ref 19–32)
Calcium: 9.7 mg/dL (ref 8.4–10.5)
Chloride: 99 meq/L (ref 96–112)
Creatinine, Ser: 0.84 mg/dL (ref 0.40–1.20)
GFR: 72.37 mL/min (ref >60.00)
Glucose, Bld: 324 mg/dL — ABNORMAL HIGH (ref 70–99)
Potassium: 4.2 meq/L (ref 3.5–5.1)
Sodium: 140 meq/L (ref 135–145)

## 2023-01-07 LAB — URINALYSIS, ROUTINE W REFLEX MICROSCOPIC
Bilirubin Urine: NEGATIVE
Hgb urine dipstick: NEGATIVE
Ketones, ur: NEGATIVE
Leukocytes,Ua: NEGATIVE
Nitrite: NEGATIVE
Specific Gravity, Urine: 1.025 (ref 1.000–1.030)
Urine Glucose: >=1000 — AB
Urobilinogen, UA: 0.2 (ref 0.0–1.0)
pH: 6 (ref 5.0–8.0)

## 2023-01-07 LAB — LIPID PANEL
Cholesterol: 206 mg/dL — ABNORMAL HIGH (ref 0–200)
HDL: 64.9 mg/dL (ref >39.00)
LDL Cholesterol: 113 mg/dL — ABNORMAL HIGH (ref 0–99)
NonHDL: 140.61
Total CHOL/HDL Ratio: 3
Triglycerides: 138 mg/dL (ref 0.0–149.0)
VLDL: 27.6 mg/dL (ref 0.0–40.0)

## 2023-01-07 LAB — MICROALBUMIN / CREATININE URINE RATIO
Creatinine,U: 193 mg/dL
Microalb Creat Ratio: 2.2 mg/g (ref 0.0–30.0)
Microalb, Ur: 4.3 mg/dL — ABNORMAL HIGH (ref 0.0–1.9)

## 2023-01-07 LAB — HEMOGLOBIN A1C: Hgb A1c MFr Bld: 12.3 % — ABNORMAL HIGH (ref 4.6–6.5)

## 2023-01-07 LAB — VITAMIN D 25 HYDROXY (VIT D DEFICIENCY, FRACTURES): VITD: 27.21 ng/mL — ABNORMAL LOW (ref 30.00–100.00)

## 2023-01-07 NOTE — Progress Notes (Signed)
[facility]ARE [facility] [address] [place] [address] Dept: [phone] Dept Fax: [phone]  Chronic Care Office Visit  Subjective:    Patient ID: [name], female    DOB: 10/08/1955, 67 y.o..   MRN: [id number]  Chief Complaint  Patient presents with   Diabetes    F/u DM.  Doesn't check BS at home.  No concerns.   Declines Vaccines.    History of Present Illness:  Patient is in today for reassessment of chronic medical issues.  [name] has a history of Type 2 diabetes. She is currently managed on metformin 1,000 mg daily and pioglitazone 30 mg daily. She tried tirzepatide int he past, but she was unable to tolerate the GI side effects of this. Her last visit was 9 months ago. Her diabetes was in poor control at that time. She does urinate twice a night.   [name] has a history of hypertension. She is managed on amlodipine-olmasartan (Azor) 10-40 mg daily. She also takes metoprolol 100 mg daily, but this is apparently more for control of frequent PVCs.   [name] has a history of hyperlipidemia. She is managed on atorvastatin 40 mg daily.  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   Depressive disorder 07/04/2012   History of endometrial cancer 05/18/2012   Hyperlipidemia 09/25/2011   GAD  (generalized anxiety disorder) 07/31/2006   Genital herpes 06/16/2006   Essential hypertension 06/16/2006   Past Surgical History:  Procedure Laterality Date   APPENDECTOMY     BREAST BIOPSY Right 12/10/2021   [place] RT BREAST BX W LOC DEV 1ST LESION IMG BX SPEC US GUIDE 12/10/2021 [facility]   BREAST BIOPSY  02/01/2022   MM RT RADIOACTIVE SEED LOC MAMMO GUIDE 02/01/2022 [facility]   BREAST LUMPECTOMY WITH RADIOACTIVE SEED LOCALIZATION Right 02/04/2022   Procedure: RIGHT BREAST LUMPECTOMY WITH RADIOACTIVE SEED LOCALIZATION;  Surgeon: Abigail Miyamoto, MD;  Location: [facility];  Service: General;  Laterality: Right;   BTL     HIATAL HERNIA REPAIR  12/1999   ROBOTIC ASSISTED TOTAL HYSTERECTOMY WITH BILATERAL SALPINGO OOPHERECTOMY Bilateral 06/23/2012   Procedure: ROBOTIC ASSISTED TOTAL HYSTERECTOMY WITH BILATERAL SALPINGO OOPHORECTOMY  LYMPH NODES, POSSIBLE LAPAROTOMY ;  Surgeon: Rejeana Brock A. Duard Brady, MD;  Location: [facility];  Service: Gynecology;  Laterality: Bilateral;   TUBAL LIGATION     Family History  Problem Relation Age of Onset   Stroke Mother    Hypertension Mother    Dementia Mother    Heart disease Father    Diabetes Father    Hypertension Father    Hypertension Sister    Cancer Brother        Melanoma   Hypertension Brother    Cancer Paternal Aunt    Cancer Maternal Grandmother    Cancer Paternal Grandmother    Outpatient Medications Prior to Visit  Medication Sig Dispense Refill   ALPRAZolam (XANAX) 0.5 MG tablet Take 0.5 tablets (0.25 mg total) by mouth daily as needed for sleep or anxiety. 1/2 tab to 1 tab 30 tablet 2   amLODipine-olmesartan (AZOR) 10-40 MG tablet TAKE 1 TABLET BY MOUTH EVERY MORNING 90 tablet 3   atorvastatin (LIPITOR) 40 MG tablet TAKE 1 TABLET(40 MG) BY MOUTH DAILY 90 tablet 3   escitalopram (LEXAPRO) 20 MG tablet TAKE 1 TABLET(20 MG) BY MOUTH DAILY 90 tablet 3   famciclovir (FAMVIR) 250 MG tablet Take 1 tablet (250 mg total)  by mouth 2 (two) times daily. 180 tablet 3   metFORMIN (GLUCOPHAGE) 1000 MG tablet TAKE 1 TABLET(1000 MG) BY MOUTH DAILY WITH A MEAL 90 tablet 3   metoprolol succinate (TOPROL-XL) 100 MG 24 hr tablet TAKE 1 TABLET BY MOUTH EVERY MORNING WITH OR IMMEDIATELY FOLLOWING A MEAL 90 tablet 3   pioglitazone (ACTOS) 30 MG tablet Take 1 tablet (30 mg total) by mouth daily. 90 tablet 3   esomeprazole (NEXIUM) 20 MG capsule Take 20 mg by mouth daily at 12 noon. Every other day     methocarbamol (ROBAXIN) 500 MG tablet Take 1 tablet (500 mg total) by mouth every 8 (eight) hours as needed for muscle spasms. 30 tablet 0   Probiotic Product (ALIGN PO) Take by mouth.     No facility-administered medications prior to visit.   Allergies  Allergen Reactions   Cola (Syrup) Anaphylaxis   Sulfa Antibiotics Hives and Rash   Objective:   Today's Vitals   01/07/23 1014  BP: 124/80  Pulse: 85  Temp: (!) 97.2 F (36.2 C)  TempSrc: Temporal  SpO2: 95%  Weight: 214 lb 12.8 oz (97.4 kg)  Height: 5\' 5"  (1.651 m)   Body mass index is 35.74 kg/m.   General: Well developed, well nourished. No acute distress. Psych: Alert and oriented. Normal mood and affect.  Health Maintenance Due  Topic Date Due   Pneumonia Vaccine 30+ Years old (1 of 2 - PCV) Never done   DEXA SCAN  Never done   Colonoscopy  10/13/2022   HEMOGLOBIN A1C  10/26/2022   Diabetic kidney evaluation - Urine ACR  12/18/2022   Diabetic kidney evaluation - eGFR measurement  02/02/2023     Assessment & Plan:   Problem List Items Addressed This Visit       Cardiovascular and Mediastinum   Essential hypertension - Primary    BP is adequately controlled. Continue amlodipine-olmasartan (Azor) 10-40 mg daily. I will check annual renal labs today.      Relevant Orders   Basic metabolic panel     Endocrine   Type 2 diabetes mellitus without complications (HCC)    We will reassess the A1c today. She did not tolerate tirzepatide and has not  been able to tolerate increasing her metformin dose. Continue metformin 1,000 mg daily and pioglitazone 30 mg daily. If A1c remains above goal, I will consider addition of Jardiance.      Relevant Orders   Lipid panel   Microalbumin / creatinine urine ratio   Basic metabolic panel   Hemoglobin A1c   Urinalysis, Routine w reflex microscopic     Other   Hyperlipidemia    I will check lipids today. Continue atorvastatin 40 mg daily.      Relevant Orders   Lipid panel   Vitamin D deficiency    I will reassess her Vitamin D today.      Relevant Orders  VITAMIN D 25 Hydroxy (Vit-D Deficiency, Fractures)   Other Visit Diagnoses     Screening for colon cancer       Needs to arrange work schedule to accomodate. Prefers to work this out first before we make a referral.       Return in about 3 months (around 04/07/2023) for Reassessment.   Loyola Mast, MD

## 2023-01-07 NOTE — Assessment & Plan Note (Signed)
We will reassess the A1c today. She did not tolerate tirzepatide and has not been able to tolerate increasing her metformin dose. Continue metformin 1,000 mg daily and pioglitazone 30 mg daily. If A1c remains above goal, I will consider addition of Jardiance.

## 2023-01-07 NOTE — Assessment & Plan Note (Signed)
I will reassess her Vitamin D today.

## 2023-01-07 NOTE — Assessment & Plan Note (Signed)
I will check lipids today. Continue atorvastatin 40 mg daily.

## 2023-01-07 NOTE — Assessment & Plan Note (Signed)
BP is adequately controlled. Continue amlodipine-olmasartan (Azor) 10-40 mg daily. I will check annual renal labs today.

## 2023-01-08 ENCOUNTER — Encounter: Payer: Self-pay | Admitting: Family Medicine

## 2023-01-08 MED ORDER — SITAGLIPTIN PHOSPHATE 25 MG PO TABS: 25.0000 mg | ORAL_TABLET | Freq: Every day | ORAL | 3 refills | Status: DC

## 2023-01-08 NOTE — Addendum Note (Signed)
Addended by: Loyola Mast on: 01/08/2023 09:50 AM   Modules accepted: Orders

## 2023-01-08 NOTE — Telephone Encounter (Signed)
Spoke to patient and answered questions.  Was advised that medication may not be covered per pharmacy.  Will call them to check on this. Dm/cma

## 2023-02-10 ENCOUNTER — Other Ambulatory Visit: Payer: Self-pay | Admitting: Surgery

## 2023-03-20 ENCOUNTER — Ambulatory Visit: Payer: No Typology Code available for payment source | Admitting: Family Medicine

## 2023-04-03 ENCOUNTER — Ambulatory Visit: Payer: No Typology Code available for payment source | Admitting: Family Medicine

## 2023-04-27 ENCOUNTER — Other Ambulatory Visit: Payer: Self-pay | Admitting: Family Medicine

## 2023-04-27 DIAGNOSIS — E119 Type 2 diabetes mellitus without complications: Secondary | ICD-10-CM

## 2023-04-28 NOTE — Telephone Encounter (Signed)
 Spoke to patient and shewill go online to schedule an appt. Dm/cma

## 2023-05-02 ENCOUNTER — Encounter: Payer: Self-pay | Admitting: Family Medicine

## 2023-05-02 ENCOUNTER — Ambulatory Visit: Admitting: Family Medicine

## 2023-05-02 VITALS — BP 132/70 | HR 83 | Temp 97.4°F | Ht 65.0 in | Wt 233.2 lb

## 2023-05-02 DIAGNOSIS — E119 Type 2 diabetes mellitus without complications: Secondary | ICD-10-CM

## 2023-05-02 DIAGNOSIS — Z1211 Encounter for screening for malignant neoplasm of colon: Secondary | ICD-10-CM

## 2023-05-02 DIAGNOSIS — Z7984 Long term (current) use of oral hypoglycemic drugs: Secondary | ICD-10-CM

## 2023-05-02 DIAGNOSIS — I10 Essential (primary) hypertension: Secondary | ICD-10-CM

## 2023-05-02 DIAGNOSIS — M25562 Pain in left knee: Secondary | ICD-10-CM | POA: Diagnosis not present

## 2023-05-02 DIAGNOSIS — R600 Localized edema: Secondary | ICD-10-CM

## 2023-05-02 DIAGNOSIS — M79671 Pain in right foot: Secondary | ICD-10-CM | POA: Diagnosis not present

## 2023-05-02 DIAGNOSIS — E782 Mixed hyperlipidemia: Secondary | ICD-10-CM

## 2023-05-02 LAB — GLUCOSE, RANDOM: Glucose, Bld: 157 mg/dL — ABNORMAL HIGH (ref 70–99)

## 2023-05-02 LAB — HEMOGLOBIN A1C: Hgb A1c MFr Bld: 9 % — ABNORMAL HIGH (ref 4.6–6.5)

## 2023-05-02 MED ORDER — SITAGLIPTIN PHOSPHATE 50 MG PO TABS: 50.0000 mg | ORAL_TABLET | Freq: Every day | ORAL | 3 refills | Status: AC

## 2023-05-02 MED ORDER — NAPROXEN 500 MG PO TABS: 500.0000 mg | ORAL_TABLET | Freq: Two times a day (BID) | ORAL | 0 refills | Status: DC

## 2023-05-02 MED ORDER — FUROSEMIDE 20 MG PO TABS: 20.0000 mg | ORAL_TABLET | Freq: Every day | ORAL | 0 refills | Status: DC

## 2023-05-02 NOTE — Assessment & Plan Note (Signed)
 BP is adequately controlled. Continue amlodipine-olmasartan (Azor) 10-40 mg daily and metoprolol succinate 100 mg daily.

## 2023-05-02 NOTE — Progress Notes (Signed)
 [facility] [address] [place] [address] Dept: [phone] Dept Fax: [phone]  Office Visit  Subjective:    Patient ID: [name], female    DOB: 10-13-1955, 68 y.o..   MRN: [id number]  Chief Complaint  Patient presents with   Leg Pain    C/o having LT leg pain x 4 days and RT foot pain x 1 yr.  Has taken Tylenol/Ibuprofen.      History of Present Illness:  Patient is in today complaining of left knee pain for the past 4 days. She notes that she misstepped on her right foot. This caused her to put her left knee in a bind. She has had some pain and swelling of the knee since then. She has a past history of left knee arthroscopy. She does not have clicking or popping int he knee. She has used some ibuprofen which helps, but has also noted more swelling of both lower legs.  [name] also notes some chronic pain in her right foot. This has been present for a couple of years. It aches much of the time.  [name] has a history of Type 2 diabetes. She is currently managed on metformin 1,000 mg daily, pioglitazone 30 mg daily, and sitagliptin 25 mg daily (added at her last visit). She tried tirzepatide in the past, but she was unable to tolerate the GI side effects of this.    [name] has a history of hypertension. She is managed on amlodipine-olmasartan (Azor) 10-40 mg daily. She also takes metoprolol succinate 100 mg daily, but this is apparently more for control of frequent PVCs.   [name] has a history of hyperlipidemia. She is managed on atorvastatin 40 mg daily.  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   Depressive disorder 07/04/2012   History of endometrial cancer 05/18/2012   Hyperlipidemia 09/25/2011   GAD (generalized anxiety disorder) 07/31/2006   Genital herpes 06/16/2006   Essential hypertension 06/16/2006   Past Surgical History:  Procedure Laterality Date   APPENDECTOMY     BREAST BIOPSY Right 12/10/2021   [place] RT BREAST BX W LOC DEV 1ST LESION IMG BX SPEC US GUIDE 12/10/2021 [facility]   BREAST BIOPSY  02/01/2022   MM RT RADIOACTIVE SEED LOC MAMMO GUIDE 02/01/2022 [facility]   BREAST LUMPECTOMY WITH RADIOACTIVE SEED LOCALIZATION Right 02/04/2022   Procedure: RIGHT BREAST LUMPECTOMY WITH RADIOACTIVE SEED LOCALIZATION;  Surgeon: Abigail Miyamoto, MD;  Location: [facility];  Service: General;  Laterality: Right;   BTL     HIATAL HERNIA REPAIR  12/1999   ROBOTIC ASSISTED TOTAL HYSTERECTOMY WITH BILATERAL SALPINGO OOPHERECTOMY Bilateral 06/23/2012   Procedure: ROBOTIC ASSISTED TOTAL HYSTERECTOMY WITH BILATERAL SALPINGO OOPHORECTOMY  LYMPH NODES, POSSIBLE LAPAROTOMY ;  Surgeon: Rejeana Brock A. Duard Brady, MD;  Location: [facility];  Service: Gynecology;  Laterality: Bilateral;  TUBAL LIGATION     Family History  Problem Relation Age of Onset   Stroke Mother    Hypertension Mother    Dementia Mother    Heart disease Father    Diabetes Father    Hypertension Father    Hypertension Sister    Cancer Brother        Melanoma   Hypertension Brother    Cancer Paternal Aunt    Cancer Maternal Grandmother    Cancer Paternal Grandmother    Outpatient Medications Prior to Visit  Medication Sig Dispense Refill   ALPRAZolam (XANAX) 0.5 MG tablet Take 0.5  tablets (0.25 mg total) by mouth daily as needed for sleep or anxiety. 1/2 tab to 1 tab 30 tablet 2   amLODipine-olmesartan (AZOR) 10-40 MG tablet TAKE 1 TABLET BY MOUTH EVERY MORNING 90 tablet 3   atorvastatin (LIPITOR) 40 MG tablet TAKE 1 TABLET(40 MG) BY MOUTH DAILY 90 tablet 3   Collagen-Vitamin C-Biotin (COLLAGEN 1500/C) 500-50-0.8 MG CAPS Take by mouth.     escitalopram (LEXAPRO) 20 MG tablet TAKE 1 TABLET(20 MG) BY MOUTH DAILY 90 tablet 3   famciclovir (FAMVIR) 250 MG tablet Take 1 tablet (250 mg total) by mouth 2 (two) times daily. 180 tablet 3   magnesium (MAGTAB) 84 MG ( ) TBCR SR tablet Take 84 mg by mouth.     metFORMIN (GLUCOPHAGE) 1000 MG tablet TAKE 1 TABLET(1000 MG) BY MOUTH DAILY WITH A MEAL 90 tablet 3   metoprolol succinate (TOPROL-XL) 100 MG 24 hr tablet TAKE 1 TABLET BY MOUTH EVERY MORNING WITH OR IMMEDIATELY FOLLOWING A MEAL 90 tablet 3   pioglitazone (ACTOS) 30 MG tablet TAKE 1 TABLET(30 MG) BY MOUTH DAILY 90 tablet 3   sitaGLIPtin (JANUVIA) 25 MG tablet Take 1 tablet (25 mg total) by mouth daily. 90 tablet 3   No facility-administered medications prior to visit.   Allergies  Allergen Reactions   Cola (Syrup) Anaphylaxis   Sulfa Antibiotics Hives and Rash     Objective:   Today's Vitals   05/02/23 1354  BP: 132/70  Pulse: 83  Temp: (!) 97.4 F (36.3 C)  TempSrc: Temporal  SpO2: 97%  Weight: 233 lb 3.2 oz (105.8 kg)  Height: 5\' 5"  (1.651 m)   Body mass index is 38.81 kg/m.   General: Well developed, well nourished. No acute distress. Extremities: Full ROM of left knee. No crepitance. No increased warmth, but mild swelling. Varus/valgus   and drawer testing are normal. The right foot shows no swelling. The pain is indicated over the 1st   tarsometatarsal joint. There is no localized redness. There is 2+ edema of both lower legs. Psych: Alert and oriented. Normal mood and affect.  Health Maintenance Due  Topic Date Due   Pneumonia Vaccine 65+ Years  old (1 of 2 - PCV) Never done   Zoster Vaccines- Shingrix (1 of 2) Never done   DEXA SCAN  Never done   COVID-19 Vaccine (3 - 2024-25 season) 09/29/2022   Colonoscopy  10/13/2022   OPHTHALMOLOGY EXAM  01/16/2023   FOOT EXAM  04/25/2023     Assessment & Plan:   Problem List Items Addressed This Visit       Cardiovascular and Mediastinum   Essential hypertension   BP is adequately controlled. Continue amlodipine-olmasartan (Azor) 10-40 mg daily and metoprolol succinate 100 mg daily.      Relevant Medications   furosemide (LASIX) 20 MG tablet     Endocrine   Type 2 diabetes  mellitus without complications (HCC)   We will reassess the A1c today. She did not tolerate tirzepatide and has not been able to tolerate increasing her metformin dose. Continue metformin 1,000 mg daily, pioglitazone 30 mg daily, and sitagliptin 25 mg daily.      Relevant Orders   Glucose, random   Hemoglobin A1c     Other   Hyperlipidemia   Continue atorvastatin 40 mg daily.      Relevant Medications   furosemide (LASIX) 20 MG tablet   Other Visit Diagnoses       Right foot pain    -  Primary   Likely arthritis. However, I will order an x-ray of the foot to assess for other signs of injury.   Relevant Orders   DG Foot Complete Right     Acute pain of left knee       Mild left knee strain. I will prescribe a 7-day course of naproxen to have her use instead of ibuprofen. She shoudl rest the knee and continue heat.   Relevant Medications   naproxen (NAPROSYN) 500 MG tablet   Other Relevant Orders   DG Foot Complete Right     Screening for colon cancer       Relevant Orders   Ambulatory referral to Gastroenterology     Lower leg edema       Likely due to recent NSAID use. I will provide a 7-day course of furosemide.   Relevant Medications   furosemide (LASIX) 20 MG tablet       Return in about 3 months (around 08/01/2023) for Reassessment.   Loyola Mast, MD

## 2023-05-02 NOTE — Addendum Note (Signed)
 Addended by: Loyola Mast on: 05/02/2023 05:16 PM   Modules accepted: Orders

## 2023-05-02 NOTE — Assessment & Plan Note (Signed)
 We will reassess the A1c today. She did not tolerate tirzepatide and has not been able to tolerate increasing her metformin dose. Continue metformin 1,000 mg daily, pioglitazone 30 mg daily, and sitagliptin 25 mg daily.

## 2023-05-02 NOTE — Assessment & Plan Note (Signed)
 Continue atorvastatin 40 mg daily.

## 2023-05-07 ENCOUNTER — Ambulatory Visit (INDEPENDENT_AMBULATORY_CARE_PROVIDER_SITE_OTHER)

## 2023-05-07 ENCOUNTER — Encounter: Payer: Self-pay | Admitting: Family Medicine

## 2023-05-07 DIAGNOSIS — M79671 Pain in right foot: Secondary | ICD-10-CM

## 2023-05-07 DIAGNOSIS — M25562 Pain in left knee: Secondary | ICD-10-CM

## 2023-05-11 ENCOUNTER — Other Ambulatory Visit: Payer: Self-pay | Admitting: Family Medicine

## 2023-05-11 DIAGNOSIS — A6000 Herpesviral infection of urogenital system, unspecified: Secondary | ICD-10-CM

## 2023-05-14 ENCOUNTER — Telehealth: Admitting: Internal Medicine

## 2023-05-15 ENCOUNTER — Ambulatory Visit (INDEPENDENT_AMBULATORY_CARE_PROVIDER_SITE_OTHER): Admitting: Podiatry

## 2023-05-15 ENCOUNTER — Ambulatory Visit

## 2023-05-15 ENCOUNTER — Ambulatory Visit (INDEPENDENT_AMBULATORY_CARE_PROVIDER_SITE_OTHER)

## 2023-05-15 DIAGNOSIS — M7751 Other enthesopathy of right foot: Secondary | ICD-10-CM

## 2023-05-15 DIAGNOSIS — M76811 Anterior tibial syndrome, right leg: Secondary | ICD-10-CM

## 2023-05-15 MED ORDER — DICLOFENAC SODIUM 75 MG PO TBEC: 75.0000 mg | DELAYED_RELEASE_TABLET | Freq: Two times a day (BID) | ORAL | 2 refills | Status: AC

## 2023-05-15 MED ORDER — TRIAMCINOLONE ACETONIDE 10 MG/ML IJ SUSP
10.0000 mg | Freq: Once | INTRAMUSCULAR | Status: AC
  Administered 2023-05-15: 10 mg via INTRA_ARTICULAR

## 2023-05-18 NOTE — Progress Notes (Signed)
 Subjective:   Patient ID: [name], female   DOB: 68 y.o.   MRN: [id number]   HPI Patient presents stating that she has been getting a lot of pain on the dorsum of the right ankle and that it has been going on now for a number of months and she does not remember injury.  States it seems worse when walking or standing but she can develop pain at any time.  Patient does not smoke likes to be active   Review of Systems  All other systems reviewed and are negative.       Objective:  Physical Exam Vitals and nursing note reviewed.  Constitutional:      Appearance: She is well-developed.  Pulmonary:     Effort: Pulmonary effort is normal.  Musculoskeletal:        General: Normal range of motion.  Skin:    General: Skin is warm.  Neurological:     Mental Status: She is alert.     Neurovascular status found to be intact muscle strength found to be adequate range of motion adequate with patient noted to have inflammation pain of the anterior tibial tendon and also into the dorsal of the ankle joint more on the medial side.  It is inflamed and difficult to tell and I was not able to ascertain with movement and I did not note any pathology or strength loss of the anterior tibial tendon.  Patient does have good digital perfusion well-oriented     Assessment:  Possibility for inflammation of the anterior tib tendon or possibility for inflammatory capsulitis or arthritis of the ankle joint right      Plan:  H&P x-ray reviewed and at this point I carefully injected the complex after explaining risk 3 mg dexamethasone  Kenalog  5 mg Xylocaine  dispense fascial brace to lift up the arch instructed on ice and discussed possible cast immobilization if symptoms persist or MRI if we are unable to reduce the symptoms present.  Reappoint in 2 weeks or earlier if needed and also placed on diclofenac  75 mg twice daily  X-rays indicate no signs of arthritis or bony impingement in this area or the  ankle joint right

## 2023-05-20 ENCOUNTER — Ambulatory Visit: Admitting: Internal Medicine

## 2023-05-20 ENCOUNTER — Encounter: Payer: Self-pay | Admitting: Internal Medicine

## 2023-05-20 VITALS — BP 136/82 | HR 74 | Temp 97.9°F | Ht 65.0 in | Wt 233.2 lb

## 2023-05-20 DIAGNOSIS — M25562 Pain in left knee: Secondary | ICD-10-CM | POA: Diagnosis not present

## 2023-05-20 DIAGNOSIS — M7751 Other enthesopathy of right foot: Secondary | ICD-10-CM

## 2023-05-20 DIAGNOSIS — M76811 Anterior tibial syndrome, right leg: Secondary | ICD-10-CM

## 2023-05-20 NOTE — Patient Instructions (Signed)
 Continue Diclofenac  2 times day as prescribed  Ice therapy in 20 minute increments  Knee brace - [employer] or [employer], elevate leg when possible

## 2023-05-20 NOTE — Progress Notes (Signed)
 [facility] [address] [place] [address] Dept: [phone] Dept Fax: [phone]  Acute Care Office Visit  Subjective:   [name] [birth year] 05/20/2023  Chief Complaint  Patient presents with   Follow-up    Left knee     HPI: Discussed the use of AI scribe software for clinical note transcription with the patient, who gave verbal consent to proceed.  History of Present Illness   [name] is a 68 year old female who presents with left knee pain and right foot pain.  She has been experiencing left knee pain for the past three to four weeks, which began after twisting her knee while trying to avoid pain in her right foot. This incident led to a loss of balance and a twisting motion outward. She has a history of arthroscopic surgery on this knee approximately ten to eleven years ago. The knee pain is described as sharp and is exacerbated when the leg is kept straight for a few minutes, particularly at night. Some relief is found by elevating the leg slightly. The knee is swollen, but there is no specific pain when pressure is applied to certain areas. She has been taking diclofenac  75 mg twice daily, which has helped reduce inflammation but not completely alleviate the pain.  Regarding her right foot, she has been experiencing issues for over a year and recently sought treatment from a podiatrist. She was diagnosed with capsulitis and tendonitis of anterior tibial tendon. She received an injection in the foot and was prescribed diclofenac , which has significantly improved her symptoms. Initially, the pain was constant, with swelling and inflammation, but it has since reduced significantly.      The following portions of the patient's history were reviewed and updated as appropriate: past medical history, past surgical history, family history, social history, allergies, medications, and problem list.    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   Depressive disorder 07/04/2012   History of endometrial cancer 05/18/2012   Hyperlipidemia 09/25/2011   GAD (generalized anxiety disorder) 07/31/2006   Genital herpes 06/16/2006   Essential hypertension 06/16/2006   Past Medical History:  Diagnosis Date   Anxiety    Cancer (HCC)    endometrial   Complication of anesthesia    slow to awaken in past   Depression    Hypertension    Kidney, benign tumor checked every 5 years   right kidney spot   Urticaria    Past Surgical History:  Procedure Laterality Date   APPENDECTOMY     BREAST BIOPSY Right 12/10/2021   US  RT BREAST BX W LOC DEV 1ST LESION IMG BX SPEC US  GUIDE 12/10/2021 [facility]   BREAST BIOPSY  02/01/2022   MM RT RADIOACTIVE SEED LOC MAMMO GUIDE 02/01/2022 [facility]   BREAST LUMPECTOMY WITH RADIOACTIVE SEED LOCALIZATION Right 02/04/2022   Procedure: RIGHT BREAST LUMPECTOMY WITH RADIOACTIVE  SEED LOCALIZATION;  Surgeon: Oza Blumenthal, MD;  Location:  [facility];  Service: General;  Laterality: Right;   BTL     HIATAL HERNIA REPAIR  12/1999   ROBOTIC ASSISTED TOTAL HYSTERECTOMY WITH BILATERAL SALPINGO OOPHERECTOMY Bilateral 06/23/2012   Procedure: ROBOTIC ASSISTED TOTAL HYSTERECTOMY WITH BILATERAL SALPINGO OOPHORECTOMY  LYMPH NODES, POSSIBLE  LAPAROTOMY ;  Surgeon: Daryel Ensign A. Clerance Dais, MD;  Location: [facility];  Service: Gynecology;  Laterality: Bilateral;   TUBAL LIGATION     Family History  Problem Relation Age of Onset   Stroke Mother    Hypertension Mother    Dementia Mother    Heart disease Father    Diabetes Father    Hypertension Father    Hypertension Sister    Cancer Brother        Melanoma   Hypertension Brother    Cancer Paternal Aunt    Cancer Maternal Grandmother    Cancer Paternal Grandmother     Current Outpatient Medications:    ALPRAZolam  (XANAX ) 0.5 MG tablet, Take 0.5 tablets (0.25 mg total) by mouth daily as needed for sleep or anxiety. 1/2 tab to 1 tab, Disp: 30 tablet, Rfl: 2   amLODipine -olmesartan  (AZOR ) 10-40 MG tablet, TAKE 1 TABLET BY MOUTH EVERY MORNING, Disp: 90 tablet, Rfl: 3   atorvastatin  (LIPITOR) 40 MG tablet, TAKE 1 TABLET(40 MG) BY MOUTH DAILY, Disp: 90 tablet, Rfl: 3   Collagen-Vitamin C-Biotin (COLLAGEN 1500/C) 500-50-0.8 MG CAPS, Take by mouth., Disp: , Rfl:    diclofenac  (VOLTAREN ) 75 MG EC tablet, Take 1 tablet (75 mg total) by mouth 2 (two) times daily., Disp: 50 tablet, Rfl: 2   escitalopram  (LEXAPRO ) 20 MG tablet, TAKE 1 TABLET(20 MG) BY MOUTH DAILY, Disp: 90 tablet, Rfl: 3   famciclovir  (FAMVIR ) 250 MG tablet, TAKE 1 TABLET BY MOUTH TWICE DAILY, Disp: 180 tablet, Rfl: 3   magnesium (MAGTAB) 84 MG ( ) TBCR SR tablet, Take 84 mg by mouth., Disp: , Rfl:    metFORMIN  (GLUCOPHAGE ) 1000 MG tablet, TAKE 1 TABLET(1000 MG) BY MOUTH DAILY WITH A MEAL, Disp: 90 tablet, Rfl: 3   metoprolol  succinate (TOPROL -XL) 100 MG 24 hr tablet, TAKE 1 TABLET BY MOUTH EVERY MORNING WITH OR IMMEDIATELY FOLLOWING A MEAL, Disp: 90 tablet, Rfl: 3   pioglitazone  (ACTOS ) 30 MG tablet, TAKE 1 TABLET(30 MG) BY MOUTH DAILY, Disp: 90 tablet, Rfl: 3   sitaGLIPtin  (JANUVIA ) 50 MG tablet, Take 1 tablet (50 mg total) by mouth daily., Disp: 90 tablet, Rfl: 3 Allergies  Allergen Reactions   Cola (Syrup) Anaphylaxis    Sulfa Antibiotics Hives and Rash     ROS: A complete ROS was performed with pertinent positives/negatives noted in the HPI. The remainder of the ROS are negative.    Objective:   Today's Vitals   05/20/23 0914  BP: 136/82  Pulse: 74  Temp: 97.9 F (36.6 C)  TempSrc: Temporal  SpO2: 98%  Weight: 233 lb 3.2 oz (105.8 kg)  Height: 5\' 5"  (1.651 m)    GENERAL: Well-appearing, in NAD. Well nourished.  SKIN: Pink, warm and dry. No rash, lesion, ulceration, or ecchymoses.  NECK: Trachea midline. Full ROM w/o pain or tenderness. No lymphadenopathy.  RESPIRATORY: Chest wall symmetrical. Respirations even and non-labored. Breath sounds clear to auscultation bilaterally.  CARDIAC: S1, S2 present, regular rate and rhythm. Peripheral pulses 2+ bilaterally.  MSK: pain to medial aspect of left knee, moderate swelling to posterior aspect of knee. Neg ant/post drawer, no pain with varus or valgus  stress. Support brace to right foot.  EXTREMITIES: Without clubbing, cyanosis, or edema.  NEUROLOGIC: No motor or sensory deficits. Steady, even gait.  PSYCH/MENTAL STATUS: Alert, oriented x 3. Cooperative, appropriate mood and affect.    No results found for any visits on 05/20/23.    Assessment & Plan:  Assessment and Plan    Left knee pain Persistent left knee pain with history of twisting injury. Previous arthroscopic surgery. Referral to orthopedics for further evaluation and imaging needed. - Continue diclofenac  75 mg twice daily. - Apply ice in 20-minute increments, avoid direct skin contact. - Elevate leg when possible, bent if more comfortable. - Use knee brace for support, available at sports stores or online. - Refer to orthopedics for further evaluation and imaging.  Capsulitis and tendonitis of right foot Capsulitis of right ankle, MTP joint, and anterior tibial tendonitis diagnosed by podiatry. Significant improvement with diclofenac  and injection. Continued improvement expected. -  Follow up with podiatry if symptoms worsen or fail to improve.      No orders of the defined types were placed in this encounter.  Orders Placed This Encounter  Procedures   Ambulatory referral to Orthopedic Surgery    Referral Priority:   Routine    Referral Type:   Surgical    Referral Reason:   Specialty Services Required    Requested Specialty:   Orthopedic Surgery    Number of Visits Requested:   1   Lab Orders  No laboratory test(s) ordered today   No images are attached to the encounter or orders placed in the encounter.  Return if symptoms worsen or fail to improve.   Gavin Kast, FNP

## 2023-05-28 ENCOUNTER — Encounter: Payer: Self-pay | Admitting: Podiatrist

## 2023-05-28 ENCOUNTER — Ambulatory Visit (INDEPENDENT_AMBULATORY_CARE_PROVIDER_SITE_OTHER): Admitting: Podiatrist

## 2023-05-28 DIAGNOSIS — M76811 Anterior tibial syndrome, right leg: Secondary | ICD-10-CM | POA: Diagnosis not present

## 2023-05-28 NOTE — Progress Notes (Signed)
 Chief Complaint  Patient presents with   Foot Pain    Follow up injection, Casulitis Right ankle. 80% better     HPI: Patient is 68 y.o. female who presents today for recheck capsulitis right ankle.  She relates that the area of the injection cleared up the discomfort she was having now she has some discomfort distal along the anterior tibialis tendon.  Relates she is about 80% improved.  States she was able to go to [place] and walked around the park for about 6 0 hours and did not have any pain afterwards.  She is happy with her improvement thus far.   Allergies  Allergen Reactions   Cola (Syrup) Anaphylaxis   Sulfa Antibiotics Hives and Rash    Review of systems is negative except as noted in the HPI.  Denies nausea/ vomiting/ fevers/ chills or night sweats.   Denies difficulty breathing, denies calf pain or tenderness  Physical Exam  Patient is awake, alert, and oriented x 3.  In no acute distress.    Vascular status is intact with palpable pedal pulses DP and PT bilateral and capillary refill time less than 3 seconds bilateral.  No edema or erythema noted.   Neurological exam reveals epicritic and protective sensation grossly intact bilateral.   Dermatological exam reveals skin is supple and dry to bilateral feet.  No open lesions present.    Musculoskeletal exam: Discomfort distal to the area of the last injection noted along the anterior tibialis tendon right foot.   Assessment:   ICD-10-CM   1. Anterior tibial tendonitis, right  M76.811        Plan: Discussed exam findings and where the injection was placed at the last visit recommended a second injection distal to the first.  She agreed prepped with alcohol up to the 3 mg of dexamethasone  phosphate with 0.5% Marcaine  plain superficial to the tendon itself.  She tolerated this well.  She will see how this does she will call in the future if this fails to resolve her symptoms otherwise should be seen back as needed for  follow-up.

## 2023-06-06 ENCOUNTER — Ambulatory Visit (INDEPENDENT_AMBULATORY_CARE_PROVIDER_SITE_OTHER): Admitting: Podiatrist

## 2023-06-06 ENCOUNTER — Encounter: Payer: Self-pay | Admitting: Podiatrist

## 2023-06-06 VITALS — Ht 65.0 in | Wt 233.0 lb

## 2023-06-06 DIAGNOSIS — M7751 Other enthesopathy of right foot: Secondary | ICD-10-CM | POA: Diagnosis not present

## 2023-06-06 DIAGNOSIS — M76811 Anterior tibial syndrome, right leg: Secondary | ICD-10-CM

## 2023-06-06 NOTE — Progress Notes (Signed)
   Chief Complaint  Patient presents with   Foot Pain    Anterior tibial tendonitis, right, had injection 05/28/2023 not getting better     HPI: Patient is 68 y.o. female who presents today for continued pain along the tibialis anterior tendon of the right foot.  She tried injection therapy and immobilization in the past which have failed to relieve her pain.  She denies any new trauma or injury.  Relates in the past she did roll the foot when stepping on a branch.  She relates her tendon pain persists and nothing seems to help.   Allergies  Allergen Reactions   Cola (Syrup) Anaphylaxis   Sulfa Antibiotics Hives and Rash    Review of systems is negative except as noted in the HPI.  Denies nausea/ vomiting/ fevers/ chills or night sweats.   Denies difficulty breathing, denies calf pain or tenderness  Physical Exam  Patient is awake, alert, and oriented x 3.  In no acute distress.    Vascular status is intact with palpable pedal pulses DP and PT bilateral and capillary refill time less than 3 seconds bilateral.  No edema or erythema noted.   Neurological exam reveals epicritic and protective sensation grossly intact bilateral.   Dermatological exam reveals skin is supple and dry to bilateral feet.  No open lesions present.    Musculoskeletal exam: Continued pain along the tibialis anterior tendon of the right foot at its insertion.  Pain along the substance of the tendon is also noted with palpation.  Swelling in this area is also noted in comparison to the left foot.   Assessment:   ICD-10-CM   1. Tendinitis of right foot  M77.51     2. Anterior tibial tendonitis, right  M76.811        Plan: Discussed exam findings, past treatments and recommendations.  At this point I recommend an MRI to see if there is a tear in the tendon.  She has failed to improve with conservative therapies over the last several weeks.  I will order her an MRI for the right ankle to assess this.  We will  make an appointment for her to come back in 1 month for recheck and to go over MRI results.

## 2023-06-10 ENCOUNTER — Other Ambulatory Visit: Payer: Self-pay | Admitting: Family Medicine

## 2023-06-10 DIAGNOSIS — I10 Essential (primary) hypertension: Secondary | ICD-10-CM

## 2023-06-13 ENCOUNTER — Other Ambulatory Visit: Payer: Self-pay | Admitting: Podiatrist

## 2023-06-27 ENCOUNTER — Ambulatory Visit: Admitting: Podiatry

## 2023-07-15 ENCOUNTER — Encounter: Payer: Self-pay | Admitting: Podiatrist

## 2023-07-15 ENCOUNTER — Ambulatory Visit: Payer: Self-pay | Admitting: Podiatry

## 2023-07-18 ENCOUNTER — Encounter: Payer: Self-pay | Admitting: Podiatry

## 2023-07-18 ENCOUNTER — Ambulatory Visit (INDEPENDENT_AMBULATORY_CARE_PROVIDER_SITE_OTHER): Admitting: Podiatry

## 2023-07-18 DIAGNOSIS — M7751 Other enthesopathy of right foot: Secondary | ICD-10-CM

## 2023-07-18 MED ORDER — TRIAMCINOLONE ACETONIDE 10 MG/ML IJ SUSP
10.0000 mg | Freq: Once | INTRAMUSCULAR | Status: AC
  Administered 2023-07-18: 10 mg via INTRA_ARTICULAR

## 2023-07-22 NOTE — Progress Notes (Signed)
 Subjective:   Patient ID: [name], female   DOB: 68 y.o.   MRN: [id number]   HPI Patient presents to review the MRI and to consider other procedures we could do to try to help her.  States that she has pain in her ankle she has pain in her forefoot and into the Achilles   ROS      Objective:  Physical Exam  Neurovascular status intact with inflammation pain mostly within the sinus tarsi and into the ankle joint right with moderate discomfort also noted in the forefoot midfoot right that is bothersome for her.  It has been present for a number of years and she never has immobilized it up to this point     Assessment:  Based on MRI it most likely is some form of inflammatory condition may involve the ligaments and possible low-grade arthritis of the ankle joint     Plan:  H&P reviewed condition I went ahead today did sterile prep and I injected the ankle joint 3 mg Kenalog  5 mg Xylocaine  and applied complete immobilization with a below the knee walking boot properly fitted in order to completely rest the area.  I did discuss the possibility for arthroscopic procedure if symptoms were to persist

## 2023-07-25 ENCOUNTER — Other Ambulatory Visit: Payer: Self-pay | Admitting: Family Medicine

## 2023-07-25 DIAGNOSIS — F411 Generalized anxiety disorder: Secondary | ICD-10-CM

## 2023-08-18 LAB — HM DIABETES EYE EXAM

## 2023-08-20 ENCOUNTER — Encounter: Payer: Self-pay | Admitting: Family Medicine

## 2023-08-29 ENCOUNTER — Telehealth: Payer: Self-pay | Admitting: Podiatry

## 2023-08-29 NOTE — Telephone Encounter (Signed)
 Called patient to confirm cancellation for 09/05/2023

## 2023-09-05 ENCOUNTER — Ambulatory Visit: Admitting: Podiatry

## 2023-09-08 ENCOUNTER — Encounter: Payer: Self-pay | Admitting: Podiatry

## 2023-09-08 ENCOUNTER — Ambulatory Visit (INDEPENDENT_AMBULATORY_CARE_PROVIDER_SITE_OTHER): Admitting: Podiatry

## 2023-09-08 DIAGNOSIS — M76811 Anterior tibial syndrome, right leg: Secondary | ICD-10-CM | POA: Diagnosis not present

## 2023-09-08 DIAGNOSIS — M7751 Other enthesopathy of right foot: Secondary | ICD-10-CM | POA: Diagnosis not present

## 2023-09-10 NOTE — Progress Notes (Signed)
 Subjective:   Patient ID: [name], female   DOB: 68 y.o.   MRN: [id number]   HPI Patient states she continues to experience discomfort in her right ankle and states that the medication and the boot have made a difference and reduced some of the inflammatory complex but she continues to have pain and states it has been approximately 10 to 12 months it has been giving her problems   ROS      Objective:  Physical Exam  Neurovascular status intact with discomfort which is centered in the medial ankle and around the medial malleolus and is pinpoint to this area with moderate edema in the area.  Previous MRI did not indicate tear of tendon or what appears to be a chronic arthritic condition associated with the pain she is experiencing     Assessment:  Possibility for subtle issues around the ankle joint right versus inflammatory condition with patient frustrated about her inability to be active and states that if she tries to take a walk it starts to become painful and stops her from doing what she would like to do     Plan:  H&P reviewed at great length and at this point I am going to send for a second opinion to one of our physicians with the possibility for further testing and possibility for ankle arthroscopy or other procedure which may gave her relief.  Patient is comfortable with this we will continue to immobilization and continue along the same course that she is on now until that is done hopefully within the next month

## 2023-12-01 ENCOUNTER — Encounter: Payer: Self-pay | Admitting: Radiology

## 2024-02-10 ENCOUNTER — Telehealth: Payer: Self-pay

## 2024-02-10 DIAGNOSIS — E782 Mixed hyperlipidemia: Secondary | ICD-10-CM

## 2024-02-10 DIAGNOSIS — F411 Generalized anxiety disorder: Secondary | ICD-10-CM

## 2024-02-10 DIAGNOSIS — I10 Essential (primary) hypertension: Secondary | ICD-10-CM

## 2024-02-10 MED ORDER — ESCITALOPRAM OXALATE 20 MG PO TABS: 20.0000 mg | ORAL_TABLET | Freq: Every day | ORAL | 0 refills | Status: AC

## 2024-02-10 MED ORDER — ATORVASTATIN CALCIUM 40 MG PO TABS: 40.0000 mg | ORAL_TABLET | Freq: Every day | ORAL | 0 refills | Status: AC

## 2024-02-10 MED ORDER — AMLODIPINE-OLMESARTAN 10-40 MG PO TABS: 1.0000 | ORAL_TABLET | Freq: Every morning | ORAL | 0 refills | Status: AC

## 2024-02-10 NOTE — Telephone Encounter (Signed)
 Received refill request for  Amlodipine /Olmasartan, Atorvastatin , and Escitalopram .   Sent a 30 day supply to the pharmacy.   Over due for an appointment LOV  05/02/23

## 2024-02-13 ENCOUNTER — Other Ambulatory Visit: Payer: Self-pay | Admitting: Family Medicine

## 2024-02-13 DIAGNOSIS — E782 Mixed hyperlipidemia: Secondary | ICD-10-CM

## 2024-02-13 DIAGNOSIS — F411 Generalized anxiety disorder: Secondary | ICD-10-CM
